# Patient Record
Sex: Female | Born: 1975 | Race: White | Hispanic: No | Marital: Single | Smoking: Current every day smoker
Health system: Southern US, Community
[De-identification: ages and names within clinical notes are randomized; demographics above are authoritative.]

## PROBLEM LIST (undated history)

## (undated) DIAGNOSIS — G8929 Other chronic pain: Secondary | ICD-10-CM

## (undated) DIAGNOSIS — M549 Dorsalgia, unspecified: Secondary | ICD-10-CM

## (undated) HISTORY — PX: CHOLECYSTECTOMY: SHX55

## (undated) HISTORY — PX: TUBAL LIGATION: SHX77

---

## 2012-07-24 ENCOUNTER — Encounter (HOSPITAL_COMMUNITY): Payer: Self-pay | Admitting: Emergency Medicine

## 2012-07-24 ENCOUNTER — Emergency Department (HOSPITAL_COMMUNITY)
Admission: EM | Admit: 2012-07-24 | Discharge: 2012-07-24 | Disposition: A | Discharge status: Home / Self Care | Payer: Self-pay | Attending: Emergency Medicine | Admitting: Emergency Medicine

## 2012-07-24 ENCOUNTER — Emergency Department (HOSPITAL_COMMUNITY): Payer: Self-pay

## 2012-07-24 DIAGNOSIS — R0789 Other chest pain: Secondary | ICD-10-CM

## 2012-07-24 DIAGNOSIS — M25519 Pain in unspecified shoulder: Secondary | ICD-10-CM | POA: Insufficient documentation

## 2012-07-24 DIAGNOSIS — R071 Chest pain on breathing: Secondary | ICD-10-CM | POA: Insufficient documentation

## 2012-07-24 DIAGNOSIS — F17200 Nicotine dependence, unspecified, uncomplicated: Secondary | ICD-10-CM | POA: Insufficient documentation

## 2012-07-24 MED ORDER — HYDROCODONE-ACETAMINOPHEN 5-325 MG PO TABS: 2.0000 | ORAL_TABLET | ORAL | Status: DC | PRN

## 2012-07-24 MED ORDER — HYDROCODONE-ACETAMINOPHEN 5-325 MG PO TABS
2.0000 | ORAL_TABLET | Freq: Once | ORAL | Status: AC
  Administered 2012-07-24: 2 via ORAL
  Filled 2012-07-24: qty 2

## 2012-07-24 NOTE — ED Notes (Signed)
Pt c/o central chest pain radiates under left arm onset yesterday when she woke up. Pt tried heating pad and ice packs along with ibuprofen without relief. Pt denies any other symptoms. Pt tearful.

## 2012-07-24 NOTE — ED Provider Notes (Addendum)
History     CSN: [id number]  Arrival date & time 07/24/12  0719   First MD Initiated Contact with Patient 07/24/12 [phone]      Chief Complaint  Patient presents with  . Chest Pain    (Consider location/radiation/quality/duration/timing/severity/associated sxs/prior treatment) HPI Complains of left shoulder pain radiating to left chest onset upon awakening 10 AM yesterday. Pain is worse with changing positions or moving her left shoulder she denies shortness of breath nausea or or sweatiness. Pain is not made worse with exertion pain is improved with remaining still. She treated herself with ibuprofen and a cold pack without relief. Cardiac risk factors smoker otherwise negative  History reviewed. No pertinent past medical history. Past medical history negative Past Surgical History  Procedure Laterality Date  . Cholecystectomy    . Tubal ligation      No family history on file.  History  Substance Use Topics  . Smoking status: Current Every Day Smoker  . Smokeless tobacco: Not on file  . Alcohol Use: No    OB History   Grav Para Term Preterm Abortions TAB SAB Ect Mult Living                  Review of Systems  Constitutional: Negative.   HENT: Negative.   Respiratory: Negative.   Cardiovascular: Positive for chest pain.  Gastrointestinal: Negative.   Musculoskeletal: Positive for arthralgias.       Left shoulder pain  Skin: Negative.   Neurological: Negative.   Psychiatric/Behavioral: Negative.   All other systems reviewed and are negative.    Allergies  Review of patient's allergies indicates no known allergies.  Home Medications  No current outpatient prescriptions on file.  BP 123/74  Pulse 88  Temp(Src) 97.7 F (36.5 C) (Oral)  Resp 22  Wt 165 lb (74.844 kg)  SpO2 99%  LMP 07/10/2012  Physical Exam  Nursing note and vitals reviewed. Constitutional: She appears well-developed and well-nourished. She appears distressed.  tearful  HENT:  Head:  Normocephalic and atraumatic.  Eyes: Conjunctivae are normal. Pupils are equal, round, and reactive to light.  Neck: Neck supple. No tracheal deviation present. No thyromegaly present.  Cardiovascular: Normal rate, regular rhythm and normal heart sounds.  Exam reveals no friction rub.   No murmur heard. Pulmonary/Chest: Effort normal and breath sounds normal. She exhibits tenderness.  Pain is easily reproducible when she sits up from a supine position or with forcible abduction of left shoulder. All 4 extremities without redness swelling or tenderness. Neurovascular intactleft upper extremity she has pain of left shoulder upon active motion  Abdominal: Soft. Bowel sounds are normal. She exhibits no distension. There is no tenderness.  Musculoskeletal: Normal range of motion. She exhibits no edema and no tenderness.  Neurological: She is alert. Coordination normal.  Skin: Skin is warm and dry. No rash noted.  Psychiatric:  tearful    ED Course  Procedures (including critical care time)  Labs Reviewed - No data to display No results found.   Date: 07/24/2012  Rate: 95  Rhythm: normal sinus rhythm  QRS Axis: normal  Intervals: normal  ST/T Wave abnormalities: nonspecific T wave changes  Conduction Disutrbances:none  Narrative Interpretation:   Old EKG Reviewed: none availableand Chest x-ray viewed by me No diagnosis found. No results found for this or any previous visit. Dg Chest Port 1 View  07/24/2012   *RADIOLOGY REPORT*  Clinical Data: Chest pain  PORTABLE CHEST - 1 VIEW  Comparison: None.  Findings:  No pneumothorax. Lungs clear.  Heart size and pulmonary vascularity normal.  No effusion.  Visualized bones unremarkable.  IMPRESSION: No acute disease   Original Report Authenticated By: D. Andria Rhein, MD    8:45 AM feels much improved after treatment with Norco.   MDM  Strongly doubt cardiac etiology in this young man drinking with highly atypical symptoms, nonspecific EKG.  Doubt pulmonary embolism atypical symptoms no shortness of breathPERC negative Symptoms and exam consistent with musculoskeletal chest pain Plan prescription Norco Referral resource guide Stop smoking encouraged    Dx #1 chest wall pain #2 tobacco abuse    Doug Sou, MD 07/24/12 [id number]  Doug Sou, MD 07/24/12 [phone]

## 2013-04-18 ENCOUNTER — Encounter (HOSPITAL_COMMUNITY): Payer: Self-pay | Admitting: Emergency Medicine

## 2013-04-18 ENCOUNTER — Emergency Department (HOSPITAL_COMMUNITY)
Admission: EM | Admit: 2013-04-18 | Discharge: 2013-04-18 | Disposition: A | Discharge status: Home / Self Care | Payer: Self-pay | Attending: Emergency Medicine | Admitting: Emergency Medicine

## 2013-04-18 DIAGNOSIS — G8929 Other chronic pain: Secondary | ICD-10-CM | POA: Insufficient documentation

## 2013-04-18 DIAGNOSIS — M549 Dorsalgia, unspecified: Secondary | ICD-10-CM

## 2013-04-18 DIAGNOSIS — F17200 Nicotine dependence, unspecified, uncomplicated: Secondary | ICD-10-CM | POA: Insufficient documentation

## 2013-04-18 DIAGNOSIS — M545 Low back pain, unspecified: Secondary | ICD-10-CM | POA: Insufficient documentation

## 2013-04-18 MED ORDER — HYDROCODONE-ACETAMINOPHEN 5-325 MG PO TABS: 1.0000 | ORAL_TABLET | ORAL | Status: DC | PRN

## 2013-04-18 MED ORDER — CYCLOBENZAPRINE HCL 10 MG PO TABS: 10.0000 mg | ORAL_TABLET | Freq: Every day | ORAL | Status: DC

## 2013-04-18 MED ORDER — KETOROLAC TROMETHAMINE 15 MG/ML IJ SOLN
30.0000 mg | Freq: Once | INTRAMUSCULAR | Status: AC
  Administered 2013-04-18: 30 mg via INTRAMUSCULAR
  Filled 2013-04-18: qty 2

## 2013-04-18 MED ORDER — KETOROLAC TROMETHAMINE 30 MG/ML IJ SOLN: 30.0000 mg | Freq: Once | INTRAMUSCULAR | Status: DC

## 2013-04-18 NOTE — ED Notes (Signed)
Per EMS: pt c/o mid - hip area back pain. Started yesterday around 1300, denies injury or heavy lifting. Has progressively worsen.

## 2013-04-18 NOTE — Discharge Instructions (Signed)
Call for a follow up appointment with a Family or Primary Care Provider.  °Return if Symptoms worsen.   °Take medication as prescribed.  ° °

## 2013-04-18 NOTE — ED Provider Notes (Signed)
CSN: [id number]     Arrival date & time 04/18/13  1853 History  This chart was scribed for non-physician practitioner Mellody DrownLauren Faizan Geraci, PA-C working with Dr. Pricilla LovelessScott Goldston by Donne Anonayla Curran, ED Scribe. This patient was seen in room WTR5/WTR5 and the patient's care was started at 2039.   First MD Initiated Contact with Patient 04/18/13 2039     Chief Complaint  Patient presents with  . Back Pain     The history is provided by the patient. No language interpreter was used.   HPI Comments: Jasmin LoraCrystal Cardona is a 38 y.o. female brought in by ambulance with a past medical history of chronic back pain, who presents to the Emergency Department complaining of gradual onset, gradually worsening mid back pain that began yesterday and bilateral chronic low back pain and spasms that shoots down her legs and worsened yesterday. She denies recent trauma or injury. Bending over makes the pain worse. She has tried 800 mg ibuprofen with little relief. She denies fever, chills, numbness or weakness in her lower extremities or any other symptoms. She has never had imaging of her back or hips. She was previously referred to a spine specialist but never followed up.     History reviewed. No pertinent past medical history. Past Surgical History  Procedure Laterality Date  . Cholecystectomy    . Tubal ligation     No family history on file. History  Substance Use Topics  . Smoking status: Current Every Day Smoker  . Smokeless tobacco: Not on file  . Alcohol Use: No   OB History   Grav Para Term Preterm Abortions TAB SAB Ect Mult Living                 Review of Systems  Constitutional: Negative for fever and chills.  Genitourinary: Negative for urgency, decreased urine volume, enuresis and difficulty urinating.  Musculoskeletal: Positive for arthralgias and back pain. Negative for gait problem, neck pain and neck stiffness.  Neurological: Negative for weakness and numbness.  All other systems reviewed and  are negative.      Allergies  Shellfish allergy  Home Medications   Current Outpatient Rx  Name  Route  Sig  Dispense  Refill  . ibuprofen (ADVIL,MOTRIN) 200 MG tablet   Oral   Take 200 mg by mouth every 8 (eight) hours as needed (pain).           BP 130/78  Pulse 82  Temp(Src) 98 F (36.7 C) (Oral)  Resp 24  SpO2 98%  Physical Exam  Nursing note and vitals reviewed. Constitutional: She appears well-developed and well-nourished. No distress.  HENT:  Head: Normocephalic and atraumatic.  Eyes: Conjunctivae are normal.  Neck: Neck supple. No tracheal deviation present.  Cardiovascular: Normal rate.   Pulmonary/Chest: Effort normal. No respiratory distress.  Musculoskeletal: Normal range of motion.       Back:  Two soft deep mobile masses overlying bilateral SI joints.  No signs of infection, overlying erythema.  Neurological: She is alert. She has normal strength. No sensory deficit. She exhibits normal muscle tone.  Reflex Scores:      Patellar reflexes are 2+ on the right side and 2+ on the left side. Skin: Skin is warm and dry.  Psychiatric: She has a normal mood and affect. Her behavior is normal.    ED Course  Procedures (including critical care time) DIAGNOSTIC STUDIES: Oxygen Saturation is 98% on RA, normal by my interpretation.    COORDINATION OF CARE: 8:42  PM Discussed treatment plan which includes Vicodin and Flexeril with pt at bedside and pt agreed to plan.    Labs Review Labs Reviewed - No data to display Imaging Review No results found.  EKG Interpretation   None       MDM   Final diagnoses:  Low back pain  Mid back pain   Pt with low back pain and mid back pain.  OSH record review, pt has been seen twice before for similar complaints (08/24/12, 02/26/13) and has not followed up with a specialist.  Patient with back pain.  No neurological deficits and normal neuro exam.  Patient can walk but states is painful.  No loss of bowel or  bladder control.  No concern for cauda equina  RICE protocol and pain medicine indicated and discussed with patient.    Meds given in ED:  Medications  ketorolac (TORADOL) 15 MG/ML injection 30 mg (30 mg Intramuscular Given 04/18/13 2126)    Discharge Medication List as of 04/18/2013  8:53 PM    START taking these medications   Details  cyclobenzaprine (FLEXERIL) 10 MG tablet Take 1 tablet (10 mg total) by mouth at bedtime., Starting 04/18/2013, Until Discontinued, Print    HYDROcodone-acetaminophen (NORCO/VICODIN) 5-325 MG per tablet Take 1 tablet by mouth every 4 (four) hours as needed., Starting 04/18/2013, Until Discontinued, Print         I personally performed the services described in this documentation, which was scribed in my presence. The recorded information has been reviewed and is accurate.     Clabe Seal, PA-C 04/21/13 [phone]

## 2013-04-22 NOTE — ED Provider Notes (Signed)
Medical screening examination/treatment/procedure(s) were performed by non-physician practitioner and as supervising physician I was immediately available for consultation/collaboration.   EKG Interpretation None        Audree CamelScott T Nehal Witting, MD 04/22/13 [phone]

## 2013-07-31 ENCOUNTER — Emergency Department (HOSPITAL_COMMUNITY): Payer: Self-pay

## 2013-07-31 ENCOUNTER — Emergency Department (HOSPITAL_COMMUNITY)
Admission: EM | Admit: 2013-07-31 | Discharge: 2013-08-01 | Disposition: A | Discharge status: Home / Self Care | Payer: Self-pay | Attending: Emergency Medicine | Admitting: Emergency Medicine

## 2013-07-31 ENCOUNTER — Encounter (HOSPITAL_COMMUNITY): Payer: Self-pay | Admitting: Emergency Medicine

## 2013-07-31 DIAGNOSIS — G8929 Other chronic pain: Secondary | ICD-10-CM | POA: Insufficient documentation

## 2013-07-31 DIAGNOSIS — F17200 Nicotine dependence, unspecified, uncomplicated: Secondary | ICD-10-CM | POA: Insufficient documentation

## 2013-07-31 DIAGNOSIS — M545 Low back pain, unspecified: Secondary | ICD-10-CM | POA: Insufficient documentation

## 2013-07-31 DIAGNOSIS — M549 Dorsalgia, unspecified: Secondary | ICD-10-CM

## 2013-07-31 DIAGNOSIS — M546 Pain in thoracic spine: Secondary | ICD-10-CM | POA: Insufficient documentation

## 2013-07-31 DIAGNOSIS — Z3202 Encounter for pregnancy test, result negative: Secondary | ICD-10-CM | POA: Insufficient documentation

## 2013-07-31 LAB — PREGNANCY, URINE: Preg Test, Ur: NEGATIVE

## 2013-07-31 LAB — URINALYSIS, ROUTINE W REFLEX MICROSCOPIC
BILIRUBIN URINE: NEGATIVE
Glucose, UA: NEGATIVE mg/dL
Hgb urine dipstick: NEGATIVE
KETONES UR: NEGATIVE mg/dL
Leukocytes, UA: NEGATIVE
NITRITE: NEGATIVE
PH: 6 (ref 5.0–8.0)
Protein, ur: NEGATIVE mg/dL
Specific Gravity, Urine: 1.011 (ref 1.005–1.030)
Urobilinogen, UA: 0.2 mg/dL (ref 0.0–1.0)

## 2013-07-31 MED ORDER — OXYCODONE-ACETAMINOPHEN 5-325 MG PO TABS
1.0000 | ORAL_TABLET | Freq: Once | ORAL | Status: AC
  Administered 2013-07-31: 1 via ORAL
  Filled 2013-07-31: qty 1

## 2013-07-31 MED ORDER — HYDROMORPHONE HCL PF 1 MG/ML IJ SOLN: 1.0000 mg | Freq: Once | INTRAMUSCULAR | Status: DC

## 2013-07-31 MED ORDER — KETOROLAC TROMETHAMINE 60 MG/2ML IM SOLN
30.0000 mg | Freq: Once | INTRAMUSCULAR | Status: AC
  Administered 2013-07-31: 30 mg via INTRAMUSCULAR
  Filled 2013-07-31: qty 2

## 2013-07-31 NOTE — ED Notes (Addendum)
Pt requesting pain medication, PA informed.

## 2013-07-31 NOTE — ED Provider Notes (Signed)
CSN: [id number]     Arrival date & time 07/31/13  1400 History  This chart was scribed for non-physician practitioner, Raymon MuttonMarissa Benjamin Casanas, PA-C working with Vanetta MuldersScott Zackowski, MD by Greggory StallionKayla Andersen, ED scribe. This patient was seen in room TR07C/TR07C and the patient's care was started at 4:22 PM.   Chief Complaint  Patient presents with  . Back Pain   The history is provided by the patient. No language interpreter was used.   HPI Comments: Jasmin Gregory is a 38 y.o. female with past medical history of chronic back pain with history of epidural injections and blood patches presenting to the ED with back pain that has exacerbated the past couple of weeks. Patient reports that the back pain is localized to her middle and lower back region. Stated the pain is worse with motion. Stated that when she bends over she feels as if her whole spine as pulling. Patient reported that she's noticed a lower back cyst has been present for the past 10 years-as per patient, reported that the cyst has gotten larger and pain has exacerbated. Stated that the discomfort radiates down her left leg. As per patient, reported that she's been using ibuprofen, magnesium, vitamin B without relief. Stated that she's been battling back pain for the past 11 years. Reported that she's been seen by physician and pain management in [place] one year ago. Denied injury, fall, numbness, tingling, loss of sensation, urinary and bowel incontinence, chest pain, shortness of breath, difficulty breathing.  History reviewed. No pertinent past medical history. Past Surgical History  Procedure Laterality Date  . Cholecystectomy    . Tubal ligation     No family history on file. History  Substance Use Topics  . Smoking status: Current Every Day Smoker  . Smokeless tobacco: Not on file  . Alcohol Use: No   OB History   Grav Para Term Preterm Abortions TAB SAB Ect Mult Living                  Review of Systems  Eyes: Negative for visual disturbance.  Respiratory: Negative for shortness of breath.   Cardiovascular: Negative for chest pain.  Genitourinary:       Negative for bowel or bladder incontinence.  Musculoskeletal: Positive for back pain.  Neurological: Negative for numbness and headaches.  All other systems reviewed and are negative.  Allergies  Shellfish allergy  Home Medications   Prior to Admission medications   Medication Sig Start Date End Date Taking? Authorizing Provider  ibuprofen (ADVIL,MOTRIN) 200 MG tablet Take 200 mg by mouth every 8 (eight) hours as needed (pain).    Yes Historical Provider, MD  cyclobenzaprine (FLEXERIL) 10 MG tablet Take 1 tablet (10 mg total) by mouth 2 (two) times daily as needed for muscle spasms. 08/01/13   Catlin Aycock, PA-C  HYDROcodone-acetaminophen (NORCO/VICODIN) 5-325 MG per tablet Take 1 tablet by mouth every 6 (six) hours as needed for moderate pain or severe pain. 08/01/13   Aniyiah Zell, PA-C  ibuprofen (ADVIL,MOTRIN) 600 MG tablet Take 1 tablet (600 mg total) by mouth every 6 (six) hours as needed. 08/01/13   Naydeline Morace, PA-C   BP 115/70  Pulse 66  Temp(Src) 97.4 F (36.3 C) (Oral)  Resp 16  Wt 165 lb (74.844 kg)  SpO2 100%  LMP 07/03/2013  Physical Exam  Nursing note and vitals reviewed. Constitutional: She is oriented to person, place, and time. She appears well-developed and well-nourished. No  distress.  HENT:  Head: Normocephalic and atraumatic.  Mouth/Throat: Oropharynx is clear and moist. No oropharyngeal exudate.  Eyes: Conjunctivae and EOM are normal. Pupils are equal, round, and reactive to light. Right eye exhibits no discharge. Left eye exhibits no discharge.  Neck: Normal range of motion. Neck supple. No tracheal deviation present.  Negative nuchal rigidity Negative neck stiffness Negative neck swelling Negative meningeal signs  Discomfort upon palpation to the mid spinal region as  well as bilateral musculature of the neck  Cardiovascular: Normal rate, regular rhythm and normal heart sounds.   Pulses:      Radial pulses are 2+ on the right side, and 2+ on the left side.       Dorsalis pedis pulses are 2+ on the right side, and 2+ on the left side.  Pulmonary/Chest: Effort normal and breath sounds normal. No respiratory distress. She has no wheezes. She has no rales.  Musculoskeletal: Normal range of motion. She exhibits tenderness. She exhibits no edema.       Back:  Negative deformities noted to the spine. Discomfort upon palpation to mid thoracic and left paraspinal lumbosacral region. Small mobile cyst palpated in the left lumbosacral region-soft but tender upon palpation. Decreased flexion and extension of the torso secondary to discomfort in the back. Decreased flexion and extension of the hip secondary to back pain. Full range of motion without difficulty or ataxia to remaining extremities.  Lymphadenopathy:    She has no cervical adenopathy.  Neurological: She is alert and oriented to person, place, and time. No cranial nerve deficit. She exhibits normal muscle tone. Coordination normal.  Cranial nerves III-XII grossly intact Strength 5+/5+ to upper and lower extremities bilaterally with resistance applied, equal distribution noted Equal grip strength Sensation intact with differentiation to sharp and dull touch Negative saddle paresthesias bilaterally Negative arm drift Fine motor skills intact Gait proper, proper balance - negative sway, negative drift, negative step-offs. Mild limp when applying pressure to the left leg.  Skin: Skin is warm and dry.  Psychiatric: She has a normal mood and affect. Her behavior is normal.    ED Course  Procedures (including critical care time)  DIAGNOSTIC STUDIES: Oxygen Saturation is 100% on RA, normal by my interpretation.    COORDINATION OF CARE: 4:31 PM-Discussed treatment plan which includes speaking with  attending with pt at bedside and pt agreed to plan.   4:54 PM This provider spoke with attending, Dr. Darlyn Chamber. Attending in room assessing patient. As per attending physician recommended patient to get MRI of the thoracic and lumbar spine.  Results for orders placed during the hospital encounter of 07/31/13  URINALYSIS, ROUTINE W REFLEX MICROSCOPIC      Result Value Ref Range   Color, Urine YELLOW  YELLOW   APPearance CLEAR  CLEAR   Specific Gravity, Urine 1.011  1.005 - 1.030   pH 6.0  5.0 - 8.0   Glucose, UA NEGATIVE  NEGATIVE mg/dL   Hgb urine dipstick NEGATIVE  NEGATIVE   Bilirubin Urine NEGATIVE  NEGATIVE   Ketones, ur NEGATIVE  NEGATIVE mg/dL   Protein, ur NEGATIVE  NEGATIVE mg/dL   Urobilinogen, UA 0.2  0.0 - 1.0 mg/dL   Nitrite NEGATIVE  NEGATIVE   Leukocytes, UA NEGATIVE  NEGATIVE  PREGNANCY, URINE      Result Value Ref Range   Preg Test, Ur NEGATIVE  NEGATIVE    Labs Review Labs Reviewed  URINALYSIS, ROUTINE W REFLEX MICROSCOPIC  PREGNANCY, URINE  POC URINE PREG,  ED    Imaging Review Mr Thoracic Spine Wo Contrast  07/31/2013   CLINICAL DATA:  Left flank and groin pain, remote history of cerebral spinal fluid leak requiring blood patch.  EXAM: MRI THORACIC AND LUMBAR SPINE WITHOUT CONTRAST  TECHNIQUE: Multiplanar and multiecho pulse sequences of the thoracic and lumbar spine were obtained without intravenous contrast.  COMPARISON:  None.  FINDINGS: MR THORACIC SPINE FINDINGS  Thoracic vertebral bodies and posterior elements are intact aligned and maintenance of the thoracic kyphosis. Intervertebral discs demonstrate normal morphology and signal characteristics. No abnormal bone marrow signal, specifically no bright STIR signal to suggest acute osseous process.  Conus medullaris terminates at at least L1, not fully imaged on this dedicated thoracic spine MRI. Thoracic spinal cord appears normal morphology and signal characteristics, unremarkable. Included prevertebral and  paraspinal soft tissues are nonsuspicious.  Mild motion degraded axial sequences show no disc bulge, canal stenosis or neural foraminal narrowing at any level. Mild lower thorax thoracic facet arthropathy.  MR LUMBAR SPINE FINDINGS  Lumbar vertebral bodies and Posterior elements are intact and aligned and maintenance of lumbar lordosis. Intervertebral discs demonstrate normal morphology and signal characteristics. Intervertebral disc demonstrate normal morphology, slight decreased T2 signal within the L4-5 disc most consistent mild desiccation. No abnormal bone marrow signal, specifically no bright STIR signal to suggest acute osseous process.  Conus medullaris terminates at L1 appears normal morphology and signal characteristics. Cauda equina is unremarkable. Included prevertebral and paraspinal soft tissues are nonsuspicious.  Level by level evaluation:  T12-L1, L1-2, L2-3: No disc bulge, canal stenosis nor neural foraminal narrowing.  L3-4: Small left extra foraminal disc protrusion with annular fissure which could affect the exited left L3 nerve. No canal stenosis. No neural foraminal narrowing.  L4-5: Annular bulging, with focal right extra foraminal annular tear, which could affect the exited right L4 nerve. No canal stenosis or neural foraminal narrowing.  L5-S1: No disc bulge, canal stenosis nor neural foraminal narrowing.  IMPRESSION: MR THORACIC SPINE IMPRESSION  Normal MRI of the thoracic spine without contrast.  MR LUMBAR SPINE IMPRESSION  No acute fracture nor malalignment.  Early lumbar spondylosis, with L3-4 and L4-5 extra foraminal annular tears which could affect the exited left L3 and right L4 nerves respectively.  No canal stenosis or neural foraminal narrowing.   Electronically Signed   By: Awilda Metro   On: 07/31/2013 22:44   Mr Lumbar Spine Wo Contrast  07/31/2013   CLINICAL DATA:  Left flank and groin pain, remote history of cerebral spinal fluid leak requiring blood patch.  EXAM: MRI  THORACIC AND LUMBAR SPINE WITHOUT CONTRAST  TECHNIQUE: Multiplanar and multiecho pulse sequences of the thoracic and lumbar spine were obtained without intravenous contrast.  COMPARISON:  None.  FINDINGS: MR THORACIC SPINE FINDINGS  Thoracic vertebral bodies and posterior elements are intact aligned and maintenance of the thoracic kyphosis. Intervertebral discs demonstrate normal morphology and signal characteristics. No abnormal bone marrow signal, specifically no bright STIR signal to suggest acute osseous process.  Conus medullaris terminates at at least L1, not fully imaged on this dedicated thoracic spine MRI. Thoracic spinal cord appears normal morphology and signal characteristics, unremarkable. Included prevertebral and paraspinal soft tissues are nonsuspicious.  Mild motion degraded axial sequences show no disc bulge, canal stenosis or neural foraminal narrowing at any level. Mild lower thorax thoracic facet arthropathy.  MR LUMBAR SPINE FINDINGS  Lumbar vertebral bodies and Posterior elements are intact and aligned and maintenance of lumbar lordosis. Intervertebral discs demonstrate normal  morphology and signal characteristics. Intervertebral disc demonstrate normal morphology, slight decreased T2 signal within the L4-5 disc most consistent mild desiccation. No abnormal bone marrow signal, specifically no bright STIR signal to suggest acute osseous process.  Conus medullaris terminates at L1 appears normal morphology and signal characteristics. Cauda equina is unremarkable. Included prevertebral and paraspinal soft tissues are nonsuspicious.  Level by level evaluation:  T12-L1, L1-2, L2-3: No disc bulge, canal stenosis nor neural foraminal narrowing.  L3-4: Small left extra foraminal disc protrusion with annular fissure which could affect the exited left L3 nerve. No canal stenosis. No neural foraminal narrowing.  L4-5: Annular bulging, with focal right extra foraminal annular tear, which could affect the  exited right L4 nerve. No canal stenosis or neural foraminal narrowing.  L5-S1: No disc bulge, canal stenosis nor neural foraminal narrowing.  IMPRESSION: MR THORACIC SPINE IMPRESSION  Normal MRI of the thoracic spine without contrast.  MR LUMBAR SPINE IMPRESSION  No acute fracture nor malalignment.  Early lumbar spondylosis, with L3-4 and L4-5 extra foraminal annular tears which could affect the exited left L3 and right L4 nerves respectively.  No canal stenosis or neural foraminal narrowing.   Electronically Signed   By: Awilda Metro   On: 07/31/2013 22:44     EKG Interpretation None      MDM   Final diagnoses:  Back pain   Medications  oxyCODONE-acetaminophen (PERCOCET/ROXICET) 5-325 MG per tablet 1 tablet (1 tablet Oral Given 07/31/13 1809)  ketorolac (TORADOL) injection 30 mg (30 mg Intramuscular Given 07/31/13 2000)  oxyCODONE-acetaminophen (PERCOCET/ROXICET) 5-325 MG per tablet 1 tablet (1 tablet Oral Given 07/31/13 2338)    Filed Vitals:   07/31/13 1815 07/31/13 2029 07/31/13 2156 07/31/13 2338  BP: 114/67 104/59 108/66 115/70  Pulse: 65 90 56 66  Temp: 98.7 F (37.1 C)  98.2 F (36.8 C) 97.4 F (36.3 C)  TempSrc: Oral   Oral  Resp: 15 16 16 16   Weight:      SpO2: 98% 100% 99% 100%    I personally performed the services described in this documentation, which was scribed in my presence. The recorded information has been reviewed and is accurate.  Patient presenting to the ED with thoracic and lumbar back pain does been ongoing for the past 11 years. As per patient reported that the pain is grossly worse over the past 3 days. Patient recently moved from [place] to [place] approximately one year ago-does not have a primary care provider or neurosurgeon. Patient reported that in [place] she was under pain management. Stated that she has not had an MRI in over 10 years stash reported on the MRI she has a cyst forming that is leading to involvement of her nerves-was recommended  to get surgery but patient is concerned do this would be to permanent nerve damage. Stated the pain is gone progressively worse over the past 3 days and has become unbearable. It is grossly tender upon palpation to the midthoracic region as well as when she flexes she has tightness all along her spine. Patient seen and assessed by attending physician, Dr. Darlyn Chamber who recommended MRI of the thoracic and lumbar spine to be performed. Urinalysis negative for infection-negative nitrites or leukocytes identified. Urine pregnancy negative. MRI of thoracic spine negative acute findings. MRI of lumbar spine negative acute fracture malalignment noted-early lumbar spondylosis with L3-4 and L4-5 extra for minimal annular tears which could affect the exiting left L3 and right L4 nurse respectively. No canal stenosis or  neural forminal narrowing noted. Cauda equina is unremarkable. Reviewed MRI in great detail with attending physician, Dr. Keane Police. Plan for patient to be discharged. Doubt pyelonephritis. Doubt UTI. Doubt kidney stones. Doubt cauda equina syndrome. Doubt epidural abscess. Patient stable, afebrile. Patient not septic appearing. Pain controlled in ED setting. Discharged patient. [facility]. Discussed with patient to avoid any physical strenuous activity. Recommended heat compressions. Discharge patient with pain medications discussed course, cautions, disposal technique. Discussed with patient to closely monitor symptoms and if symptoms are to worsen or change to report back to the ED - strict return instructions given.  Patient agreed to plan of care, understood, all questions answered.   Raymon Mutton, PA-C 08/01/13 1715

## 2013-07-31 NOTE — ED Notes (Signed)
PT is still in the MRI DEPT.

## 2013-07-31 NOTE — ED Notes (Signed)
Pt. Stated, I've had bad back pain for 3 days.  The pain shoots across my hips.  I have a cyst on my back.

## 2013-07-31 NOTE — ED Notes (Signed)
MRI called to inquire about pt's scan.

## 2013-07-31 NOTE — ED Notes (Signed)
Pt reports has cyst to left lower back, past 2 days worsening back pain. Denies urinary s/s. Reports epidural's and spinal blood patches 10 years ago. Pt can bear wt pain worse with movement

## 2013-07-31 NOTE — ED Provider Notes (Signed)
Medical screening examination/treatment/procedure(s) were conducted as a shared visit with non-physician practitioner(s) and myself.  I personally evaluated the patient during the encounter.   EKG Interpretation None      Patient seen by me. Patient with a long-standing history of back problems. Recently moved here from [place] has not gotten plugged in with any regular doctor yet. Patient denies any numbness or weakness to her feet. But does have some shooting pain down into the legs. Patient's concerned about having a cyst on her back. She said the MRI that was done 11 years ago showed assessed she is concerned it that's getting bigger. Patient also had several injections to her mid thoracic lumbar area she states in the past. She is not febrile not toxic however we'll go ahead and do the MRI of the lumbar thoracic area to get a baseline of what is going on here. If MRI without significant findings patient can be discharged home with pain control. Patient will need appropriate referrals.  Vanetta Mulders, MD 07/31/13 [phone]

## 2013-08-01 MED ORDER — CYCLOBENZAPRINE HCL 10 MG PO TABS: 10.0000 mg | ORAL_TABLET | Freq: Two times a day (BID) | ORAL | Status: DC | PRN

## 2013-08-01 MED ORDER — HYDROCODONE-ACETAMINOPHEN 5-325 MG PO TABS: 1.0000 | ORAL_TABLET | Freq: Four times a day (QID) | ORAL | Status: DC | PRN

## 2013-08-01 MED ORDER — IBUPROFEN 600 MG PO TABS: 600.0000 mg | ORAL_TABLET | Freq: Four times a day (QID) | ORAL | Status: DC | PRN

## 2013-08-01 NOTE — Discharge Instructions (Signed)
Please call and set up an appointment with health and wellness Center Please call and set up an appointment with neurosurgery Please rest and stay hydrated Please take medications as prescribed-while on medications there is to be no drinking alcohol, driving, operating any heavy machinery. If there is extra please dispose in a proper manner. Please do not take any extra Tylenol for this can lead to Tylenol overdose and liver issues. Please apply warm compressions and massage with icy hot ointment Please continue to monitor symptoms closely and if symptoms are to worsen or change (fever greater than 101, chills, chest pain, shortness of breath, difficulty breathing, numbness, tingling, fall, injury, loss of sensation to legs, weakness, inability to control urine or bowel movements, abdominal pain, nausea, vomiting, worsening or changes to pain pattern) please report back to emergency department immediately   Back Pain, Adult Low back pain is very common. About 1 in 5 people have back pain.The cause of low back pain is rarely dangerous. The pain often gets better over time.About half of people with a sudden onset of back pain feel better in just 2 weeks. About 8 in 10 people feel better by 6 weeks.  CAUSES Some common causes of back pain include:  Strain of the muscles or ligaments supporting the spine.  Wear and tear (degeneration) of the spinal discs.  Arthritis.  Direct injury to the back. DIAGNOSIS Most of the time, the direct cause of low back pain is not known.However, back pain can be treated effectively even when the exact cause of the pain is unknown.Answering your caregiver's questions about your overall health and symptoms is one of the most accurate ways to make sure the cause of your pain is not dangerous. If your caregiver needs more information, he or she may order lab work or imaging tests (X-rays or MRIs).However, even if imaging tests show changes in your back, this usually  does not require surgery. HOME CARE INSTRUCTIONS For many people, back pain returns.Since low back pain is rarely dangerous, it is often a condition that people can learn to [facility] their own.   Remain active. It is stressful on the back to sit or stand in one place. Do not sit, drive, or stand in one place for more than 30 minutes at a time. Take short walks on level surfaces as soon as pain allows.Try to increase the length of time you walk each day.  Do not stay in bed.Resting more than 1 or 2 days can delay your recovery.  Do not avoid exercise or work.Your body is made to move.It is not dangerous to be active, even though your back may hurt.Your back will likely heal faster if you return to being active before your pain is gone.  Pay attention to your body when you bend and lift. Many people have less discomfortwhen lifting if they bend their knees, keep the load close to their bodies,and avoid twisting. Often, the most comfortable positions are those that put less stress on your recovering back.  Find a comfortable position to sleep. Use a firm mattress and lie on your side with your knees slightly bent. If you lie on your back, put a pillow under your knees.  Only take over-the-counter or prescription medicines as directed by your caregiver. Over-the-counter medicines to reduce pain and inflammation are often the most helpful.Your caregiver may prescribe muscle relaxant drugs.These medicines help dull your pain so you can more quickly return to your normal activities and healthy exercise.  Put ice  on the injured area.  Put ice in a plastic bag.  Place a towel between your skin and the bag.  Leave the ice on for 15-20 minutes, 03-04 times a day for the first 2 to 3 days. After that, ice and heat may be alternated to reduce pain and spasms.  Ask your caregiver about trying back exercises and gentle massage. This may be of some benefit.  Avoid feeling anxious or  stressed.Stress increases muscle tension and can worsen back pain.It is important to recognize when you are anxious or stressed and learn ways to manage it.Exercise is a great option. SEEK MEDICAL CARE IF:  You have pain that is not relieved with rest or medicine.  You have pain that does not improve in 1 week.  You have new symptoms.  You are generally not feeling well. SEEK IMMEDIATE MEDICAL CARE IF:   You have pain that radiates from your back into your legs.  You develop new bowel or bladder control problems.  You have unusual weakness or numbness in your arms or legs.  You develop nausea or vomiting.  You develop abdominal pain.  You feel faint. Document Released: 02/09/2005 Document Revised: 08/11/2011 Document Reviewed: 06/30/2010 [facility] Patient Information 2014 [place], [place].  Back Exercises Back exercises help treat and prevent back injuries. The goal of back exercises is to increase the strength of your abdominal and back muscles and the flexibility of your back. These exercises should be started when you no longer have back pain. Back exercises include:  Pelvic Tilt. Lie on your back with your knees bent. Tilt your pelvis until the lower part of your back is against the floor. Hold this position 5 to 10 sec and repeat 5 to 10 times.  Knee to Chest. Pull first 1 knee up against your chest and hold for 20 to 30 seconds, repeat this with the other knee, and then both knees. This may be done with the other leg straight or bent, whichever feels better.  Sit-Ups or Curl-Ups. Bend your knees 90 degrees. Start with tilting your pelvis, and do a partial, slow sit-up, lifting your trunk only 30 to 45 degrees off the floor. Take at least 2 to 3 seconds for each sit-up. Do not do sit-ups with your knees out straight. If partial sit-ups are difficult, simply do the above but with only tightening your abdominal muscles and holding it as directed.  Hip-Lift. Lie on your back  with your knees flexed 90 degrees. Push down with your feet and shoulders as you raise your hips a couple inches off the floor; hold for 10 seconds, repeat 5 to 10 times.  Back arches. Lie on your stomach, propping yourself up on bent elbows. Slowly press on your hands, causing an arch in your low back. Repeat 3 to 5 times. Any initial stiffness and discomfort should lessen with repetition over time.  Shoulder-Lifts. Lie face down with arms beside your body. Keep hips and torso pressed to floor as you slowly lift your head and shoulders off the floor. Do not overdo your exercises, especially in the beginning. Exercises may cause you some mild back discomfort which lasts for a few minutes; however, if the pain is more severe, or lasts for more than 15 minutes, do not continue exercises until you see your caregiver. Improvement with exercise therapy for back problems is slow.  See your caregivers for assistance with developing a proper back exercise program. Document Released: 03/19/2004 Document Revised: 05/04/2011 Document Reviewed: 12/11/2010 ExitCare  Patient Information 2014 [place], [place].   Emergency Department Resource Guide 1) Find a Doctor and Pay Out of Pocket Although you won't have to find out who is covered by your insurance plan, it is a good idea to ask around and get recommendations. You will then need to call the office and see if the doctor you have chosen will accept you as a new patient and what types of options they offer for patients who are self-pay. Some doctors offer discounts or will set up payment plans for their patients who do not have insurance, but you will need to ask so you aren't surprised when you get to your appointment.  2) Contact Your Local Health Department Not all health departments have doctors that can see patients for sick visits, but many do, so it is worth a call to see if yours does. If you don't know where your local health department is, you can check  in your phone book. The CDC also has a tool to help you locate your state's health department, and many state websites also have listings of all of their local health departments.  3) Find a Walk-in Clinic If your illness is not likely to be very severe or complicated, you may want to try a walk in clinic. These are popping up all over the country in pharmacies, drugstores, and shopping centers. They're usually staffed by nurse practitioners or physician assistants that have been trained to treat common illnesses and complaints. They're usually fairly quick and inexpensive. However, if you have serious medical issues or chronic medical problems, these are probably not your best option.  No Primary Care Doctor: - Call Health Connect at  [phone] - they can help you locate a primary care doctor that  accepts your insurance, provides certain services, etc. - Physician Referral Service- [phone]  Chronic Pain Problems: Organization         Address  Phone   Notes  [facility]  [phone] Patients need to be referred by their primary care doctor.   Medication Assistance: Organization         Address  Phone   Notes  [facility] Medication [facility] [address] [place] [place], [place] [address] [phone] --Must be a resident of [facility] -- Must have NO insurance coverage whatsoever (no Medicaid/ Medicare, etc.) -- The pt. MUST have a primary care doctor that directs their care regularly and follows them in the community   [facility]ist  [phone]   [employer]  [phone]    Agencies that provide inexpensive medical care: Organization         Address  Phone   Notes  [facility]  [phone]   [facility]    [phone]   [facility] [address] [place], [place] [address] [phone]   [facility] [address], [place] [phone]    Planned Parenthood    [phone]   [facility]    [phone]   [facility] and [facility]  [address], [place] Phone:  [phone], Fax:  [phone] Hours of Operation:  9 am - 6 pm, M-F.  Also accepts Medicaid/Medicare and self-pay.  [facility]  [address], [place] Phone: [phone], Fax: [phone]. Hours of Operation:  8:30 am - 5:30 pm, M-F.  Also accepts  Medicaid and self-pay.  [facility] [place] [address] [place] Phone: [phone]   [facility] [address] Natasha Bence [place], [place] [phone], Ext. [phone] Mondays & Thursdays: 7-9 AM.  First 15 patients are seen on a first come, first serve basis.    Medicaid-accepting [facility] Providers:  Organization         Address  Phone   Notes  [facility] [address], [place] [phone] Also accepts self-pay patients.  [facility] [address] Laurell Josephs [place], [place]  [phone]   [facility] [address], [place] [phone]   [facility] [address], [place] [phone]   Renaye Rakers [address], [place]   [phone] Only accepts [place] patients after they have their name applied to their card.   Self-Pay (no insurance) in [facility]:  Organization         Address  Phone   Notes  Sickle Cell Patients, [facility] Internal Medicine [address] [place], [place] [phone]   [facility] [address] [place], [place] [phone]   [facility] [place]  [address], [place] [phone]   [facility]/Dr. Osei-Bonsu  [address], [place] or [address], [place] [phone] Phone number for both [place] and [place] locations is the  same.  Urgent Medical and [facility] [address], [place] [phone]   [facility] [address], [place] or [address] [phone]   [facility] [address], [place] [phone], phone; [phone], fax Sees patients 1st and 3rd Saturday of every month.  Must not qualify for public or private insurance (i.e. Medicaid, Medicare, [place] Health Choice, Veterans' Benefits)  Household income should be no more than 200% of the poverty level The clinic cannot treat you if you are pregnant or think you are pregnant  Sexually transmitted diseases are not treated at the clinic.    Dental Care: Organization         Address  Phone  Notes  [facility] Department of [facility] [address] [place], [place] [phone] Accepts children up to age 93 who are enrolled in [place] or [place] Health Choice; pregnant women with a Medicaid card; and children who have applied for Medicaid or [place] Health Choice, but were declined, whose parents can pay a reduced fee at time of service.  [facility] Department of [facility]  [address], [place] [phone] Accepts children up to age 31 who are enrolled in [place] or [place] Health Choice; pregnant women with a Medicaid card; and children who have applied for Medicaid or [place] Health Choice, but were declined, whose parents can pay a reduced fee at time of service.  [facility]  [address] [place], [place] [phone] Patients are seen by appointment only. Walk-ins are not accepted. [facility] will see patients 2 years of age and older. Monday - Tuesday (8am-5pm) Most Wednesdays (8:30-5pm) $30 per visit, cash only  [facility] Adult Dental Access PROGRAM  [address], [facility] [place] [phone] Patients are seen by appointment only. Walk-ins are not accepted. [facility] will see patients  4 years of age and older. One Wednesday Evening (Monthly: Volunteer Based).  $30 per visit, cash only  [employer]  [phone] for adults; Children under age 62, call Graduate Pediatric Dentistry at [phone]. Children aged 40-14, please call [phone] to request a pediatric application.  Dental services are provided in all areas of dental care including fillings, crowns and bridges, complete and partial dentures, implants, gum treatment, root canals, and extractions. Preventive care is also provided. Treatment is provided to both adults and children. Patients are selected via a lottery and there is often a waiting list.   [facility] [address] [place] [address], [place]  [phone] [email or web address]   [facility] [address] [place], [place] [phone], Ext. [phone] Second and Fourth Thursday of each month, opens at 6:30 AM; Clinic ends at 9 AM.  Patients are seen on a first-come first-served basis, and a limited number are seen during each clinic.   [facility]  [address] Ether Griffins [place], [place] [phone]   Eligibility Requirements You must have lived in [place], [place], or [place] counties for at least the last three months.   You cannot be eligible for state or federal sponsored [place], including [employer], [place], or [employer].   You generally cannot be eligible for healthcare insurance through your employer.    How to apply: Eligibility screenings are held every Tuesday and Wednesday afternoon from 1:00 pm until 4:00 pm. You do not need an appointment for the interview!  [facility] [address] [place], [place] [phone]   [facility] Health Department  [phone]   [facility] Health Department  [phone]   [facility] Health Department  [phone]    Behavioral Health Resources in the Community: Intensive Outpatient  Programs Organization         Address  Phone  Notes  [facility] [address], [place], [place] [phone]   [facility] Outpatient [address], [place], [place] [phone]   [facility] [address], [place], [place]  [phone]   [facility] Mental Health [address],  [place], [place] [phone] or [phone]   Substance Abuse Resources Organization         Address  Phone  Notes  [facility]  [phone]   [facility]  [phone]   [facility] [place]  [phone]   Floydene Flock  [phone]   Residential & Outpatient Substance Abuse Program  [phone]   Psychological Services Organization         Address  Phone  Notes  [facility]  [phone]   [facility]  [phone]   [facility] Mental Health [address] [place] [phone] or [phone]    Mobile Crisis Teams Organization         Address  Phone  Notes  [facility], Mobile Crisis Care Unit  [phone]   [facility]  [address] [place], [place] [phone]   Doristine Locks [address] [place] [phone]    Self-Help/Support Groups Organization         Address  Phone             Notes  [facility] [place] - variety of support groups  336- I7437963 Call for more information  Narcotics Anonymous (NA), [facility] [address] [place]  2 meetings at this location   Residential Treatment Programs Organization         Address  Phone  Notes  [facility] [address],    [place]  [phone]   [facility]  [address] [place] [address], [place], [place] [phone]   [facility] [address] [place], [place] [phone] Admissions: 8am-3pm M-F  [facility] [address],    [place], [place]  [phone]   [facility] [address] [place], [place], [place] [phone]   [facility] [address]  [place], [place] [phone]   [facility] - Intensive Outpatient [address] Laurell Josephs [address], [place], [place] [phone]   [facility] ([facility]iction [facility] [address] [place]  [place], [place] [phone] or [phone]   [facility] ([facility]) [address], [place], [place] [phone] Accepts Medicaid  [facility] [place] [address],  [place] [phone] Substance Abuse/Addiction Treatment   [facility] Organization         Address  Phone  Notes  [facility]  [phone]   Angie Fava, PhD [address] Ervin Knack [place], [place]   ([phone] or [phone]   [facility] Behavioral   [address] [place], [place] [phone]   [facility] [address] [place], [place] [phone] Insurance/Medicaid/sponsorship through [facility] and Families [address]                                    [place], [place] [phone] Therapy/tele-psych/case  [facility] [address], [place] [phone]    Dr. Lolly Mustache  [phone]   [facility]  [employer] [facility] Dept. 1) [address], [place] 2) [address] [place] 3)  [address], [place] [phone]  [phone]   [facility] Child Abuse Hotline 256-781-3058 or 804-265-2891 (After Hours)

## 2013-08-01 NOTE — ED Notes (Signed)
Declined W/C at D/C and was escorted to lobby by RN. 

## 2013-09-04 ENCOUNTER — Encounter (HOSPITAL_COMMUNITY): Payer: Self-pay | Admitting: Emergency Medicine

## 2013-09-04 ENCOUNTER — Emergency Department (HOSPITAL_COMMUNITY)
Admission: EM | Admit: 2013-09-04 | Discharge: 2013-09-04 | Disposition: A | Discharge status: Home / Self Care | Payer: Self-pay | Attending: Emergency Medicine | Admitting: Emergency Medicine

## 2013-09-04 DIAGNOSIS — F17200 Nicotine dependence, unspecified, uncomplicated: Secondary | ICD-10-CM | POA: Insufficient documentation

## 2013-09-04 DIAGNOSIS — Z791 Long term (current) use of non-steroidal anti-inflammatories (NSAID): Secondary | ICD-10-CM | POA: Insufficient documentation

## 2013-09-04 DIAGNOSIS — G8929 Other chronic pain: Secondary | ICD-10-CM | POA: Insufficient documentation

## 2013-09-04 DIAGNOSIS — L049 Acute lymphadenitis, unspecified: Secondary | ICD-10-CM | POA: Insufficient documentation

## 2013-09-04 HISTORY — DX: Other chronic pain: G89.29

## 2013-09-04 HISTORY — DX: Dorsalgia, unspecified: M54.9

## 2013-09-04 MED ORDER — OXYCODONE-ACETAMINOPHEN 5-325 MG PO TABS
1.0000 | ORAL_TABLET | Freq: Once | ORAL | Status: AC
  Administered 2013-09-04: 1 via ORAL
  Filled 2013-09-04: qty 1

## 2013-09-04 MED ORDER — AMOXICILLIN 500 MG PO CAPS: 500.0000 mg | ORAL_CAPSULE | Freq: Three times a day (TID) | ORAL | Status: DC

## 2013-09-04 MED ORDER — AMOXICILLIN 500 MG PO CAPS
500.0000 mg | ORAL_CAPSULE | Freq: Once | ORAL | Status: AC
  Administered 2013-09-04: 500 mg via ORAL
  Filled 2013-09-04: qty 1

## 2013-09-04 MED ORDER — OXYCODONE-ACETAMINOPHEN 5-325 MG PO TABS: 1.0000 | ORAL_TABLET | ORAL | Status: DC | PRN

## 2013-09-04 NOTE — ED Notes (Signed)
Declined W/C at D/C and was escorted to lobby by RN. 

## 2013-09-04 NOTE — ED Provider Notes (Signed)
CSN: [id number]     Arrival date & time 09/04/13  [id number] History  This chart was scribed for non-physician practitioner Emilia BeckKaitlyn Lorianne Malbrough, PA-C working with Rolland PorterMark James, MD by Leone PayorSonum Patel, ED Scribe. This patient was seen in room TR05C/TR05C and the patient's care was started at 9:42 AM.    Chief Complaint  Patient presents with  . Jaw Pain    The history is provided by the patient. No language interpreter was used.    HPI Comments: Jasmin Gregory is a 38 y.o. female who presents to the Emergency Department complaining of 1 day of gradual onset, gradually worsening, constant right sided jaw pain with associated jaw swelling that began after chewing a gumball. She states initially she had no pain but it has progressed to 8/10, sharp pain with tingling sensations. She reports the swelling has progressed to the right neck upon waking this morning. She states the pain is worse with swallowing and opening her jaw. She has tried warm compresses, tylenol, and benadryl without relief. She denies dental pain, otalgia, sore throat, SOB, fever.   Past Medical History  Diagnosis Date  . Chronic back pain     Takes meds for chronic pain but is out of meds   Past Surgical History  Procedure Laterality Date  . Cholecystectomy    . Tubal ligation     History reviewed. No pertinent family history. History  Substance Use Topics  . Smoking status: Current Every Day Smoker  . Smokeless tobacco: Not on file  . Alcohol Use: No   OB History   Grav Para Term Preterm Abortions TAB SAB Ect Mult Living                 Review of Systems  HENT: Positive for facial swelling. Negative for dental problem, sore throat and trouble swallowing.        +jaw pain  Respiratory: Negative for shortness of breath.   Cardiovascular: Negative for chest pain.      Allergies  Shellfish allergy  Home Medications   Prior to Admission medications   Medication Sig Start Date End Date Taking? Authorizing Provider   cyclobenzaprine (FLEXERIL) 10 MG tablet Take 1 tablet (10 mg total) by mouth 2 (two) times daily as needed for muscle spasms. 08/01/13   Marissa Sciacca, PA-C  HYDROcodone-acetaminophen (NORCO/VICODIN) 5-325 MG per tablet Take 1 tablet by mouth every 6 (six) hours as needed for moderate pain or severe pain. 08/01/13   Marissa Sciacca, PA-C  ibuprofen (ADVIL,MOTRIN) 200 MG tablet Take 200 mg by mouth every 8 (eight) hours as needed (pain).     Historical Provider, MD  ibuprofen (ADVIL,MOTRIN) 600 MG tablet Take 1 tablet (600 mg total) by mouth every 6 (six) hours as needed. 08/01/13   Marissa Sciacca, PA-C   BP 110/68  Pulse 79  Temp(Src) 98.6 F (37 C) (Oral)  Ht 5\' 8"  (1.727 m)  Wt 161 lb (73.029 kg)  BMI 24.49 kg/m2  SpO2 99% Physical Exam  Nursing note and vitals reviewed. Constitutional: She is oriented to person, place, and time. She appears well-developed and well-nourished.  HENT:  Head: Normocephalic and atraumatic.  Mouth/Throat: Oropharynx is clear and moist. No oropharyngeal exudate.  Good dentition. No abscess noted.   Eyes: Conjunctivae and EOM are normal. Pupils are equal, round, and reactive to light.  Neck: Normal range of motion.  Prominent right cervical adenopathy that is tender to palpation.   Cardiovascular: Normal rate, regular rhythm and normal heart sounds.  Pulmonary/Chest: Effort normal and breath sounds normal. No respiratory distress. She has no wheezes. She has no rales.  Lymphadenopathy:    She has cervical adenopathy.  Neurological: She is alert and oriented to person, place, and time.  Skin: Skin is warm and dry.  Psychiatric: She has a normal mood and affect. Her behavior is normal.    ED Course  Procedures (including critical care time)  DIAGNOSTIC STUDIES: Oxygen Saturation is 99% on RA, normal by my interpretation.    COORDINATION OF CARE: 9:44 AM Discussed treatment plan with pt at bedside and pt agreed to plan.   Labs Review Labs Reviewed -  No data to display  Imaging Review No results found.   EKG Interpretation None      MDM   Final diagnoses:  Lymphadenitis, acute   9:54 AM Patient appears to have lymphadenitis. Patient will be treated with Percocet and amoxicillin. Patient has stable vitals and is afebrile. Patient instructed to return with worsening or concerning symptoms. No signs of ludwig's angina or deep tissue infection.   I personally performed the services described in this documentation, which was scribed in my presence. The recorded information has been reviewed and is accurate.   Emilia Beck, [place] 09/04/13 [phone]

## 2013-09-04 NOTE — ED Notes (Signed)
Pt reports Rt sided jaw pain and swelling after chewing gum last night. Pain scale 8/10.

## 2013-09-04 NOTE — Discharge Instructions (Signed)
Take Amoxicillin as directed until gone. Take Percocet as needed for pain. Refer to attached documents for more information. Return to the ED with worsening or concerning symptoms.

## 2013-09-12 NOTE — ED Provider Notes (Signed)
Medical screening examination/treatment/procedure(s) were performed by non-physician practitioner and as supervising physician I was immediately available for consultation/collaboration.   EKG Interpretation None        Rolland PorterMark Vegas Coffin, MD 09/12/13 [phone]

## 2013-10-17 ENCOUNTER — Encounter (HOSPITAL_COMMUNITY): Payer: Self-pay | Admitting: Emergency Medicine

## 2013-10-17 ENCOUNTER — Emergency Department (HOSPITAL_COMMUNITY)
Admission: EM | Admit: 2013-10-17 | Discharge: 2013-10-17 | Disposition: A | Discharge status: Home / Self Care | Payer: BC Managed Care – PPO | Attending: Family Medicine | Admitting: Family Medicine

## 2013-10-17 DIAGNOSIS — R269 Unspecified abnormalities of gait and mobility: Secondary | ICD-10-CM | POA: Diagnosis not present

## 2013-10-17 DIAGNOSIS — G8929 Other chronic pain: Secondary | ICD-10-CM | POA: Insufficient documentation

## 2013-10-17 DIAGNOSIS — M545 Low back pain, unspecified: Secondary | ICD-10-CM | POA: Diagnosis not present

## 2013-10-17 DIAGNOSIS — F17200 Nicotine dependence, unspecified, uncomplicated: Secondary | ICD-10-CM | POA: Insufficient documentation

## 2013-10-17 DIAGNOSIS — Z791 Long term (current) use of non-steroidal anti-inflammatories (NSAID): Secondary | ICD-10-CM | POA: Insufficient documentation

## 2013-10-17 MED ORDER — KETOROLAC TROMETHAMINE 30 MG/ML IJ SOLN
30.0000 mg | Freq: Once | INTRAMUSCULAR | Status: AC
  Administered 2013-10-17: 30 mg via INTRAMUSCULAR
  Filled 2013-10-17: qty 1

## 2013-10-17 MED ORDER — CYCLOBENZAPRINE HCL 5 MG PO TABS: 5.0000 mg | ORAL_TABLET | Freq: Three times a day (TID) | ORAL | Status: DC | PRN

## 2013-10-17 MED ORDER — KETOROLAC TROMETHAMINE 10 MG PO TABS: 10.0000 mg | ORAL_TABLET | Freq: Four times a day (QID) | ORAL | Status: DC | PRN

## 2013-10-17 MED ORDER — METHYLPREDNISOLONE ACETATE 40 MG/ML IJ SUSP
80.0000 mg | Freq: Once | INTRAMUSCULAR | Status: AC
  Administered 2013-10-17: 80 mg via INTRAMUSCULAR

## 2013-10-17 NOTE — Discharge Instructions (Signed)
Heat to back and medicine as needed, see neurosurg as advised.

## 2013-10-17 NOTE — ED Provider Notes (Signed)
CSN: [id number]     Arrival date & time 10/17/13  1116 History   First MD Initiated Contact with Patient 10/17/13 1245     Chief Complaint  Patient presents with  . Back Pain     (Consider location/radiation/quality/duration/timing/severity/associated sxs/prior Treatment) Patient is a 38 y.o. female presenting with back pain. The history is provided by the patient.  Back Pain Location:  Lumbar spine Quality:  Stabbing and stiffness Radiates to:  Does not radiate Pain severity:  Moderate Chronicity:  Chronic Context: physical stress and recent injury   Context comment:  Seen in 07/2013 with MRI -neg,just started school with carrying books and sitting., sx worse in low back. Worsened by:  Sitting and bending Associated symptoms: no abdominal pain, no fever, no leg pain, no numbness, no tingling and no weakness     Past Medical History  Diagnosis Date  . Chronic back pain     Takes meds for chronic pain but is out of meds   Past Surgical History  Procedure Laterality Date  . Cholecystectomy    . Tubal ligation     No family history on file. History  Substance Use Topics  . Smoking status: Current Every Day Smoker  . Smokeless tobacco: Not on file  . Alcohol Use: No   OB History   Grav Para Term Preterm Abortions TAB SAB Ect Mult Living                 Review of Systems  Constitutional: Negative.  Negative for fever.  Gastrointestinal: Negative.  Negative for abdominal pain.  Genitourinary: Negative.   Musculoskeletal: Positive for back pain and gait problem. Negative for joint swelling and myalgias.  Skin: Negative.   Neurological: Negative for tingling, weakness and numbness.      Allergies  Shellfish allergy  Home Medications   Prior to Admission medications   Medication Sig Start Date End Date Taking? Authorizing Provider  ibuprofen (ADVIL,MOTRIN) 200 MG tablet Take 800 mg by mouth 3 (three) times daily.    Yes Historical Provider, MD  cyclobenzaprine  (FLEXERIL) 5 MG tablet Take 1 tablet (5 mg total) by mouth 3 (three) times daily as needed for muscle spasms. 10/17/13   Linna Hoff, MD  ketorolac (TORADOL) 10 MG tablet Take 1 tablet (10 mg total) by mouth every 6 (six) hours as needed. For pain 10/17/13   Linna Hoff, MD   BP 112/75  Pulse 79  Temp(Src) 98.2 F (36.8 C) (Oral)  Resp 20  SpO2 100% Physical Exam  Nursing note and vitals reviewed. Constitutional: She is oriented to person, place, and time. She appears well-developed and well-nourished.  Abdominal: Soft. Bowel sounds are normal. There is no tenderness.  Musculoskeletal: She exhibits tenderness.  Neurological: She is alert and oriented to person, place, and time.  Skin: Skin is warm and dry.    ED Course  Procedures (including critical care time) Labs Review Labs Reviewed - No data to display  Imaging Review No results found.   EKG Interpretation None      MDM   Final diagnoses:  Bilateral low back pain without sciatica        Linna Hoff, MD 10/17/13 1321

## 2013-10-17 NOTE — ED Notes (Signed)
Back pain x 3 days has started  School has been walking a lot and carrying books makes it hurt worse had a recent MRI of back

## 2013-10-17 NOTE — ED Notes (Signed)
Patient states she was referred to neurosurgeon the last time she was here, but she did not follow up because she didn't have any insurance at that time.

## 2014-03-25 ENCOUNTER — Emergency Department (HOSPITAL_COMMUNITY): Payer: BLUE CROSS/BLUE SHIELD

## 2014-03-25 ENCOUNTER — Emergency Department (HOSPITAL_COMMUNITY)
Admission: EM | Admit: 2014-03-25 | Discharge: 2014-03-25 | Disposition: A | Discharge status: Home / Self Care | Payer: BLUE CROSS/BLUE SHIELD | Attending: Emergency Medicine | Admitting: Emergency Medicine

## 2014-03-25 ENCOUNTER — Encounter (HOSPITAL_COMMUNITY): Payer: Self-pay | Admitting: Emergency Medicine

## 2014-03-25 DIAGNOSIS — Z9851 Tubal ligation status: Secondary | ICD-10-CM | POA: Insufficient documentation

## 2014-03-25 DIAGNOSIS — R1031 Right lower quadrant pain: Secondary | ICD-10-CM | POA: Insufficient documentation

## 2014-03-25 DIAGNOSIS — G8929 Other chronic pain: Secondary | ICD-10-CM | POA: Diagnosis not present

## 2014-03-25 DIAGNOSIS — Z9049 Acquired absence of other specified parts of digestive tract: Secondary | ICD-10-CM | POA: Diagnosis not present

## 2014-03-25 DIAGNOSIS — Z720 Tobacco use: Secondary | ICD-10-CM | POA: Diagnosis not present

## 2014-03-25 DIAGNOSIS — R112 Nausea with vomiting, unspecified: Secondary | ICD-10-CM | POA: Diagnosis not present

## 2014-03-25 DIAGNOSIS — Z79899 Other long term (current) drug therapy: Secondary | ICD-10-CM | POA: Insufficient documentation

## 2014-03-25 LAB — CBC WITH DIFFERENTIAL/PLATELET
BASOS PCT: 0 % (ref 0–1)
Basophils Absolute: 0 10*3/uL (ref 0.0–0.1)
EOS ABS: 0.1 10*3/uL (ref 0.0–0.7)
Eosinophils Relative: 1 % (ref 0–5)
HCT: 40.9 % (ref 36.0–46.0)
Hemoglobin: 13.4 g/dL (ref 12.0–15.0)
LYMPHS ABS: 2 10*3/uL (ref 0.7–4.0)
LYMPHS PCT: 24 % (ref 12–46)
MCH: 27.8 pg (ref 26.0–34.0)
MCHC: 32.8 g/dL (ref 30.0–36.0)
MCV: 84.9 fL (ref 78.0–100.0)
Monocytes Absolute: 0.8 10*3/uL (ref 0.1–1.0)
Monocytes Relative: 9 % (ref 3–12)
NEUTROS ABS: 5.3 10*3/uL (ref 1.7–7.7)
Neutrophils Relative %: 66 % (ref 43–77)
Platelets: 318 10*3/uL (ref 150–400)
RBC: 4.82 MIL/uL (ref 3.87–5.11)
RDW: 13.5 % (ref 11.5–15.5)
WBC: 8.1 10*3/uL (ref 4.0–10.5)

## 2014-03-25 LAB — COMPREHENSIVE METABOLIC PANEL
ALBUMIN: 3.8 g/dL (ref 3.5–5.2)
ALK PHOS: 62 U/L (ref 39–117)
ALT: 9 U/L (ref 0–35)
ANION GAP: 7 (ref 5–15)
AST: 19 U/L (ref 0–37)
BILIRUBIN TOTAL: 0.5 mg/dL (ref 0.3–1.2)
BUN: 7 mg/dL (ref 6–23)
CO2: 24 mmol/L (ref 19–32)
CREATININE: 0.75 mg/dL (ref 0.50–1.10)
Calcium: 8.9 mg/dL (ref 8.4–10.5)
Chloride: 108 mmol/L (ref 96–112)
GFR calc Af Amer: >90 mL/min (ref >90)
Glucose, Bld: 90 mg/dL (ref 70–99)
Potassium: 3.8 mmol/L (ref 3.5–5.1)
SODIUM: 139 mmol/L (ref 135–145)
Total Protein: 6.5 g/dL (ref 6.0–8.3)

## 2014-03-25 LAB — URINALYSIS, ROUTINE W REFLEX MICROSCOPIC
BILIRUBIN URINE: NEGATIVE
Glucose, UA: NEGATIVE mg/dL
KETONES UR: NEGATIVE mg/dL
Nitrite: NEGATIVE
PROTEIN: NEGATIVE mg/dL
Specific Gravity, Urine: 1.007 (ref 1.005–1.030)
UROBILINOGEN UA: 0.2 mg/dL (ref 0.0–1.0)
pH: 6.5 (ref 5.0–8.0)

## 2014-03-25 LAB — URINE MICROSCOPIC-ADD ON

## 2014-03-25 LAB — LIPASE, BLOOD: Lipase: 25 U/L (ref 11–59)

## 2014-03-25 LAB — I-STAT BETA HCG BLOOD, ED (MC, WL, AP ONLY)

## 2014-03-25 MED ORDER — KETOROLAC TROMETHAMINE 30 MG/ML IJ SOLN
30.0000 mg | Freq: Once | INTRAMUSCULAR | Status: AC
  Administered 2014-03-25: 30 mg via INTRAVENOUS
  Filled 2014-03-25: qty 1

## 2014-03-25 MED ORDER — FENTANYL CITRATE 0.05 MG/ML IJ SOLN
50.0000 ug | Freq: Once | INTRAMUSCULAR | Status: AC
  Administered 2014-03-25: 50 ug via INTRAVENOUS
  Filled 2014-03-25: qty 2

## 2014-03-25 MED ORDER — SODIUM CHLORIDE 0.9 % IV SOLN
1000.0000 mL | Freq: Once | INTRAVENOUS | Status: AC
  Administered 2014-03-25: 1000 mL via INTRAVENOUS

## 2014-03-25 MED ORDER — IOHEXOL 300 MG/ML  SOLN
100.0000 mL | Freq: Once | INTRAMUSCULAR | Status: AC | PRN
  Administered 2014-03-25: 100 mL via INTRAVENOUS

## 2014-03-25 MED ORDER — ONDANSETRON HCL 4 MG/2ML IJ SOLN
4.0000 mg | Freq: Once | INTRAMUSCULAR | Status: AC
  Administered 2014-03-25: 4 mg via INTRAVENOUS
  Filled 2014-03-25: qty 2

## 2014-03-25 MED ORDER — IOHEXOL 300 MG/ML  SOLN
25.0000 mL | Freq: Once | INTRAMUSCULAR | Status: AC | PRN
  Administered 2014-03-25: 25 mL via ORAL

## 2014-03-25 MED ORDER — SODIUM CHLORIDE 0.9 % IV BOLUS (SEPSIS)
1000.0000 mL | Freq: Once | INTRAVENOUS | Status: AC
  Administered 2014-03-25: 1000 mL via INTRAVENOUS

## 2014-03-25 MED ORDER — HYDROMORPHONE HCL 1 MG/ML IJ SOLN
1.0000 mg | Freq: Once | INTRAMUSCULAR | Status: AC
  Administered 2014-03-25: 1 mg via INTRAVENOUS
  Filled 2014-03-25: qty 1

## 2014-03-25 MED ORDER — KETOROLAC TROMETHAMINE 10 MG PO TABS: 10.0000 mg | ORAL_TABLET | Freq: Four times a day (QID) | ORAL | Status: AC | PRN

## 2014-03-25 NOTE — Discharge Instructions (Signed)
As discussed, your evaluation here suggested that you have kidney stones contributing to your abdominal pain.  Please be sure to follow-up with your physician and our urology colleagues  Return here for concerning changes in your condition.

## 2014-03-25 NOTE — ED Provider Notes (Signed)
CSN: [id number]     Arrival date & time 03/25/14  0806 History   First MD Initiated Contact with Patient 03/25/14 [phone]     Chief Complaint  Patient presents with  . Abdominal Pain    HPI  Patient presents with new pain, nausea, vomiting. Pain began 3 days ago, without clear precipitant.  Since onset patient has had sharp pain in the right lower quadrant.  Pain occasionally radiates diffusely. There is associated chills, vomiting, loose/mucousy stool. Patient is currently menstruating. Patient has a history of cholecystectomy, tubal ligation. No relief with anything.   Past Medical History  Diagnosis Date  . Chronic back pain     Takes meds for chronic pain but is out of meds   Past Surgical History  Procedure Laterality Date  . Cholecystectomy    . Tubal ligation     History reviewed. No pertinent family history. History  Substance Use Topics  . Smoking status: Current Every Day Smoker  . Smokeless tobacco: Not on file  . Alcohol Use: No   OB History    No data available     Review of Systems  Constitutional:       Per HPI, otherwise negative  HENT:       Per HPI, otherwise negative  Respiratory:       Per HPI, otherwise negative  Cardiovascular:       Per HPI, otherwise negative  Gastrointestinal: Positive for nausea, vomiting and abdominal pain. Negative for blood in stool.  Endocrine:       Negative aside from HPI  Genitourinary:       Neg aside from HPI   Musculoskeletal:       Per HPI, otherwise negative  Skin: Negative.   Neurological: Negative for syncope.      Allergies  Shellfish allergy  Home Medications   Prior to Admission medications   Medication Sig Start Date End Date Taking? Authorizing Provider  acetaminophen (TYLENOL) 500 MG tablet Take 1,000 mg by mouth every 6 (six) hours as needed (pain).   Yes Historical Provider, MD  albuterol (PROVENTIL HFA;VENTOLIN HFA) 108 (90 BASE) MCG/ACT inhaler Inhale 2 puffs into the lungs every 6  (six) hours as needed for wheezing or shortness of breath.   Yes Historical Provider, MD  cyclobenzaprine (FLEXERIL) 5 MG tablet Take 1 tablet (5 mg total) by mouth 3 (three) times daily as needed for muscle spasms. Patient not taking: Reported on 03/25/2014 10/17/13   Linna HoffJames D Kindl, MD  ketorolac (TORADOL) 10 MG tablet Take 1 tablet (10 mg total) by mouth every 6 (six) hours as needed. For pain Patient not taking: Reported on 03/25/2014 10/17/13   Linna HoffJames D Kindl, MD   BP 104/70 mmHg  Pulse 65  Temp(Src) 98.1 F (36.7 C) (Oral)  Resp 16  Ht 5\' 7"  (1.702 m)  Wt 162 lb (73.483 kg)  BMI 25.37 kg/m2  SpO2 100%  LMP 03/24/2014 (Exact Date) Physical Exam  Constitutional: She is oriented to person, place, and time. She appears well-developed and well-nourished. No distress.  HENT:  Head: Normocephalic and atraumatic.  Eyes: Conjunctivae and EOM are normal.  Cardiovascular: Normal rate and regular rhythm.   Pulmonary/Chest: Effort normal and breath sounds normal. No stridor. No respiratory distress.  Abdominal: She exhibits no distension. There is tenderness in the right lower quadrant. There is tenderness at McBurney's point. There is no rigidity and negative Murphy's sign.  Musculoskeletal: She exhibits no edema.  Neurological: She is alert and oriented  to person, place, and time. No cranial nerve deficit.  Skin: Skin is warm and dry.  Psychiatric: She has a normal mood and affect.  Nursing note and vitals reviewed.   ED Course  Procedures (including critical care time) Labs Review Labs Reviewed  URINALYSIS, ROUTINE W REFLEX MICROSCOPIC - Abnormal; Notable for the following:    Hgb urine dipstick LARGE (*)    Leukocytes, UA TRACE (*)    All other components within normal limits  CBC WITH DIFFERENTIAL/PLATELET  URINE MICROSCOPIC-ADD ON  COMPREHENSIVE METABOLIC PANEL  LIPASE, BLOOD  I-STAT BETA HCG BLOOD, ED ([facility], [facility], [facility] ONLY)    Imaging Review Ct Abdomen Pelvis W  Contrast  03/25/2014   CLINICAL DATA:  RLQ PAIN X 3 DAYS, FEVER, N/V, HX CHOLECYSTECTOMY  EXAM: CT ABDOMEN AND PELVIS WITH CONTRAST  TECHNIQUE: Multidetector CT imaging of the abdomen and pelvis was performed using the standard protocol following bolus administration of intravenous contrast.  CONTRAST:  OMNIPAQUE IOHEXOL 300 MG/ML  SOLN  COMPARISON:  None.  FINDINGS: Normal appendix.  Mild dilation of the right renal collecting system and portions of the right ureter all the way to the pelvis poorly ureter extends along the posterior margin of the right ovary and is poorly defined. Small stone is noted in this general location, which cannot be confidently placed within the ureter. It may reflect phlebolith. There is no ovarian mass. There is no adjacent adnexal inflammation or free fluid.  No renal masses. Left renal collecting system and ureter are normal. No intrarenal stones. Bladder is unremarkable.  Clear lung bases.  Heart normal size.  Liver, spleen, pancreas, adrenal glands:  Normal.  Normal colon and small bowel.  Normal bony structures.  IMPRESSION: 1. Normal appendix. 2. Mild dilation of the right intrarenal collecting system and right ureter. There is a small stone in the right pelvis that could potentially be a ureteral stone but is more likely a phlebolith. The ureter comes in close contact with a normal appearing right ovary. This mild intrarenal collecting system dilation may simply be physiologic. 3. Exam otherwise unremarkable.   Electronically Signed   By: Amie Portland M.D.   On: 03/25/2014 10:01   10:33 AM Patient still in pain  11:51 AM Patient substantially better appearing.  We discussed all findings, including the strong possibility of kidney stone as causative etiology. She'll follow-up with primary care and urology.   MDM  Patient presents with abdominal pain, nausea, and is now tender abdomen on exam.  Patient's CT scan, urinalysis consistent with nephrolithiasis.  No  evidence for acute appendicitis nor other acute GI pathology. Patient improved substantially here, was discharged in stable condition.   Gerhard Munch, MD 03/25/14 [phone]

## 2014-03-25 NOTE — ED Notes (Addendum)
Pt reports RLQ pain nonradiating. Constipation and anorexia. Pt menstruating. Pt reports emesis x 2 at home; VSS.

## 2014-04-19 ENCOUNTER — Other Ambulatory Visit: Payer: Self-pay | Admitting: Urology

## 2014-04-19 DIAGNOSIS — R102 Pelvic and perineal pain: Secondary | ICD-10-CM

## 2014-04-22 ENCOUNTER — Emergency Department (HOSPITAL_COMMUNITY)
Admission: EM | Admit: 2014-04-22 | Discharge: 2014-04-22 | Disposition: A | Discharge status: Home / Self Care | Payer: BLUE CROSS/BLUE SHIELD | Attending: Emergency Medicine | Admitting: Emergency Medicine

## 2014-04-22 ENCOUNTER — Encounter (HOSPITAL_COMMUNITY): Payer: Self-pay | Admitting: Emergency Medicine

## 2014-04-22 DIAGNOSIS — R109 Unspecified abdominal pain: Secondary | ICD-10-CM | POA: Insufficient documentation

## 2014-04-22 DIAGNOSIS — M545 Low back pain, unspecified: Secondary | ICD-10-CM

## 2014-04-22 DIAGNOSIS — R110 Nausea: Secondary | ICD-10-CM | POA: Diagnosis not present

## 2014-04-22 DIAGNOSIS — R55 Syncope and collapse: Secondary | ICD-10-CM | POA: Insufficient documentation

## 2014-04-22 DIAGNOSIS — Z720 Tobacco use: Secondary | ICD-10-CM | POA: Insufficient documentation

## 2014-04-22 DIAGNOSIS — G8929 Other chronic pain: Secondary | ICD-10-CM | POA: Diagnosis not present

## 2014-04-22 DIAGNOSIS — Z79899 Other long term (current) drug therapy: Secondary | ICD-10-CM | POA: Insufficient documentation

## 2014-04-22 LAB — URINALYSIS, ROUTINE W REFLEX MICROSCOPIC
BILIRUBIN URINE: NEGATIVE
Glucose, UA: NEGATIVE mg/dL
KETONES UR: NEGATIVE mg/dL
Nitrite: NEGATIVE
PROTEIN: NEGATIVE mg/dL
SPECIFIC GRAVITY, URINE: 1.005 (ref 1.005–1.030)
UROBILINOGEN UA: 0.2 mg/dL (ref 0.0–1.0)
pH: 6.5 (ref 5.0–8.0)

## 2014-04-22 LAB — COMPREHENSIVE METABOLIC PANEL
ALBUMIN: 4 g/dL (ref 3.5–5.2)
ALT: 13 U/L (ref 0–35)
AST: 16 U/L (ref 0–37)
Alkaline Phosphatase: 58 U/L (ref 39–117)
Anion gap: 7 (ref 5–15)
BUN: 5 mg/dL — ABNORMAL LOW (ref 6–23)
CO2: 23 mmol/L (ref 19–32)
CREATININE: 0.61 mg/dL (ref 0.50–1.10)
Calcium: 9 mg/dL (ref 8.4–10.5)
Chloride: 110 mmol/L (ref 96–112)
GFR calc Af Amer: >90 mL/min (ref >90)
GFR calc non Af Amer: >90 mL/min (ref >90)
Glucose, Bld: 92 mg/dL (ref 70–99)
POTASSIUM: 3.6 mmol/L (ref 3.5–5.1)
SODIUM: 140 mmol/L (ref 135–145)
Total Bilirubin: 0.7 mg/dL (ref 0.3–1.2)
Total Protein: 6.9 g/dL (ref 6.0–8.3)

## 2014-04-22 LAB — CBC WITH DIFFERENTIAL/PLATELET
BASOS ABS: 0 10*3/uL (ref 0.0–0.1)
BASOS PCT: 0 % (ref 0–1)
EOS ABS: 0 10*3/uL (ref 0.0–0.7)
Eosinophils Relative: 0 % (ref 0–5)
HEMATOCRIT: 39.9 % (ref 36.0–46.0)
Hemoglobin: 13 g/dL (ref 12.0–15.0)
Lymphocytes Relative: 22 % (ref 12–46)
Lymphs Abs: 2.2 10*3/uL (ref 0.7–4.0)
MCH: 27.8 pg (ref 26.0–34.0)
MCHC: 32.6 g/dL (ref 30.0–36.0)
MCV: 85.4 fL (ref 78.0–100.0)
MONO ABS: 1 10*3/uL (ref 0.1–1.0)
MONOS PCT: 10 % (ref 3–12)
Neutro Abs: 6.6 10*3/uL (ref 1.7–7.7)
Neutrophils Relative %: 68 % (ref 43–77)
Platelets: 331 10*3/uL (ref 150–400)
RBC: 4.67 MIL/uL (ref 3.87–5.11)
RDW: 13.7 % (ref 11.5–15.5)
WBC: 9.9 10*3/uL (ref 4.0–10.5)

## 2014-04-22 LAB — URINE MICROSCOPIC-ADD ON

## 2014-04-22 LAB — WET PREP, GENITAL
Clue Cells Wet Prep HPF POC: NONE SEEN
TRICH WET PREP: NONE SEEN
Yeast Wet Prep HPF POC: NONE SEEN

## 2014-04-22 LAB — LIPASE, BLOOD: Lipase: 19 U/L (ref 11–59)

## 2014-04-22 MED ORDER — HYDROCODONE-ACETAMINOPHEN 5-325 MG PO TABS: 1.0000 | ORAL_TABLET | Freq: Four times a day (QID) | ORAL | Status: AC | PRN

## 2014-04-22 MED ORDER — FENTANYL CITRATE 0.05 MG/ML IJ SOLN
100.0000 ug | Freq: Once | INTRAMUSCULAR | Status: AC
  Administered 2014-04-22: 100 ug via INTRAVENOUS
  Filled 2014-04-22: qty 2

## 2014-04-22 MED ORDER — NAPROXEN 500 MG PO TABS: 500.0000 mg | ORAL_TABLET | Freq: Two times a day (BID) | ORAL | Status: AC

## 2014-04-22 MED ORDER — ONDANSETRON HCL 4 MG/2ML IJ SOLN
4.0000 mg | Freq: Once | INTRAMUSCULAR | Status: AC
  Administered 2014-04-22: 4 mg via INTRAVENOUS
  Filled 2014-04-22: qty 2

## 2014-04-22 MED ORDER — SODIUM CHLORIDE 0.9 % IV BOLUS (SEPSIS)
1000.0000 mL | Freq: Once | INTRAVENOUS | Status: AC
  Administered 2014-04-22: 1000 mL via INTRAVENOUS

## 2014-04-22 MED ORDER — CYCLOBENZAPRINE HCL 10 MG PO TABS: 10.0000 mg | ORAL_TABLET | Freq: Two times a day (BID) | ORAL | Status: AC | PRN

## 2014-04-22 NOTE — Discharge Instructions (Signed)
Flank Pain °Flank pain refers to pain that is located on the side of the body between the upper abdomen and the back. The pain may occur over a short period of time (acute) or may be long-term or reoccurring (chronic). It may be mild or severe. Flank pain can be caused by many things. °CAUSES  °Some of the more common causes of flank pain include: °· Muscle strains.   °· Muscle spasms.   °· A disease of your spine (vertebral disk disease).   °· A lung infection (pneumonia).   °· Fluid around your lungs (pulmonary edema).   °· A kidney infection.   °· Kidney stones.   °· A very painful skin rash caused by the chickenpox virus (shingles).   °· Gallbladder disease.   °HOME CARE INSTRUCTIONS  °Home care will depend on the cause of your pain. In general, °· Rest as directed by your caregiver. °· Drink enough fluids to keep your urine clear or pale yellow. °· Only take over-the-counter or prescription medicines as directed by your caregiver. Some medicines may help relieve the pain. °· Tell your caregiver about any changes in your pain. °· Follow up with your caregiver as directed. °SEEK IMMEDIATE MEDICAL CARE IF:  °· Your pain is not controlled with medicine.   °· You have new or worsening symptoms. °· Your pain increases.   °· You have abdominal pain.   °· You have shortness of breath.   °· You have persistent nausea or vomiting.   °· You have swelling in your abdomen.   °· You feel faint or pass out.   °· You have blood in your urine. °· You have a fever or persistent symptoms for more than 2-3 days. °· You have a fever and your symptoms suddenly get worse. °MAKE SURE YOU:  °· Understand these instructions. °· Will watch your condition. °· Will get help right away if you are not doing well or get worse. °Document Released: 04/02/2005 Document Revised: 11/04/2011 Document Reviewed: 09/24/2011 °ExitCare® Patient Information ©2015 [employer]. This information is not intended to replace advice given to you by your  health care provider. Make sure you discuss any questions you have with your health care provider. ° ° °Emergency Department Resource Guide °1) Find a Doctor and Pay Out of Pocket °Although you won't have to find out who is covered by your insurance plan, it is a good idea to ask around and get recommendations. You will then need to call the office and see if the doctor you have chosen will accept you as a new patient and what types of options they offer for patients who are self-pay. Some doctors offer discounts or will set up payment plans for their patients who do not have insurance, but you will need to ask so you aren't surprised when you get to your appointment. ° °2) Contact Your Local Health Department °Not all health departments have doctors that can see patients for sick visits, but many do, so it is worth a call to see if yours does. If you don't know where your local health department is, you can check in your phone book. The CDC also has a tool to help you locate your state's health department, and many state websites also have listings of all of their local health departments. ° °3) Find a Walk-in Clinic °If your illness is not likely to be very severe or complicated, you may want to try a walk in clinic. These are popping up all over the country in pharmacies, drugstores, and shopping centers. They're usually staffed by nurse practitioners   or physician assistants that have been trained to treat common illnesses and complaints. They're usually fairly quick and inexpensive. However, if you have serious medical issues or chronic medical problems, these are probably not your best option. ° °No Primary Care Doctor: °- Call [facility] at  832-8000 - they can help you locate a primary care doctor that  accepts your insurance, provides certain services, etc. °- Physician Referral Service- 1-800-533-3463 ° °Chronic Pain Problems: °Organization         Address  Phone   Notes  °[facility]  (336) 297-2271 Patients need to be referred by their primary care doctor.  ° °Medication Assistance: °Organization         Address  Phone   Notes  °[place] [facility] [address] °[place], [place] [address] (336) 641-8030 --Must be a resident of [place] °-- Must have NO insurance coverage whatsoever (no Medicaid/ Medicare, etc.) °-- The pt. MUST have a primary care doctor that directs their care regularly and follows them in the community °  °[facility]  (866) 331-1348   °[employer]  (888) 892-1162   ° °Agencies that provide inexpensive medical care: °Organization         Address  Phone   Notes  °[facility]  (336) 832-8035   °[facility]    (336) 832-7272   °[facility] [address] °[place], [place] [address] (336) 832-4777   °[facility] [address], °[place] (336) 271-4999   °[facility]    (336) 373-0678   °[facility]    (336) 272-1050   °[facility] ° [address], [place] Phone:  (336) 832-4444, Fax:  (336) 832-4440 Hours of Operation:  9 am - 6 pm, M-F.  Also accepts Medicaid/Medicare and self-pay.  °[facility] ° [address], [place] Phone: (336) 832-3150, Fax: (336) 832-3151. Hours of Operation:  8:30 am - 5:30 pm, M-F.  Also accepts Medicaid and self-pay.  °[facility] [place] [address], [place] Phone: (336) 878-6027   °[facility] [address], [place], [place] (336)723-1848, Ext. 123 Mondays & Thursdays: 7-9 AM.  First 15 patients are seen on a first come, first serve basis. °  ° °Medicaid-accepting [place] Providers: ° °Organization         Address  Phone   Notes  °[facility] [address], [place] (336) 641-2100 Also accepts self-pay patients.  °[facility] [address],  [place] ° (336) 856-9996   °[facility] [address], [place] (336) 288-8857   °[facility] [address] [place] [address], [place] (336) 299-7000   °Veita Bland [address], [place]  ° (336) 373-1557 Only accepts [place] Access Medicaid patients after they have their name applied to their card.  ° °Self-Pay (no insurance) in [place]: ° °Organization         Address  Phone   Notes  °Sickle Cell Patients, [facility] [address], [place] (336) 832-1970   °[facility] [address],  (336) 832-4400   °[facility] [place] ° [address], [place] (336) 992-4800   °[facility]/Dr. Osei-Bonsu ° [address]   [place] [address], [place] or [address], [place] (336) 841-8500 Phone number for both [place] and [place] locations is the same.  °[facility] [address], [place] (336) 299-0000   °[facility] [address] [place] [address], [place] or [address] (336) 852-7530 °(336) 878-2260   °[facility] [address], [place] (336) 350-1642, phone; (336) 294-5005, fax Sees patients 1st and 3rd Saturday of every month.  Must not qualify for public or private insurance (i.e. Medicaid, Medicare, [place] Health Choice, Veterans' Benefits) • Household income should be no more than 200% of the poverty level •The clinic cannot treat you if you are pregnant or think you are pregnant • Sexually transmitted diseases are not treated at the clinic.  ° ° °Dental Care: °Organization         Address  Phone  Notes  °[facility] [address], [place] (336) 641-6152 Accepts children up to age 21 who are enrolled in Medicaid or [place] Health Choice; pregnant women with a Medicaid card; and children who have applied for Medicaid or [place] Health  Choice, but were declined, whose parents can pay a reduced fee at time of service.  °[facility] [place]  [address], [place] (336) 641-7733 Accepts children up to age 21 who are enrolled in Medicaid or [place] Health Choice; pregnant women with a Medicaid card; and children who have applied for Medicaid or [place] Health Choice, but were declined, whose parents can pay a reduced fee at time of service.  °[facility] ° [address], [place] (336) 641-4533 Patients are seen by appointment only. Walk-ins are not accepted. [facility] will see patients 18 years of age and older. °Monday - Tuesday (8am-5pm) °Most Wednesdays (8:30-5pm) °$30 per visit, cash only  °[facility] ° [address], [place] (336) 641-4533 Patients are seen by appointment only. Walk-ins are not accepted. [facility] will see patients 18 years of age and older. °One Wednesday Evening (Monthly: Volunteer Based).  $30 per visit, cash only  °[facility]  (919) 537-3737 for adults; Children under age 4, call Graduate Pediatric Dentistry at (919) [phone]. Children aged 4-14, please call (919) [phone] to request a pediatric application. ° Dental services are provided in all areas of dental care including fillings, crowns and bridges, complete and partial dentures, implants, gum treatment, root canals, and extractions. Preventive care is also provided. Treatment is provided to both adults and children. °Patients are selected via a lottery and there is often a waiting list. °  °[facility] [address], °[place] ° (336) 763-8833 [email or web address] °  °[facility] [address], [place], [place] (336)723-1848, Ext. 123 Second and Fourth Thursday of each month, opens at 6:30 AM; Clinic ends at 9 AM.  Patients are seen on a first-come first-served basis, and a limited number are seen during each  clinic.  ° °[facility] ° [address], [place],  (336) 723-7904   Eligibility Requirements °You must have lived in [place], [place], or [place] counties for at least the last three months. °  You cannot be eligible for state or federal sponsored healthcare insurance, including Veterans Administration, Medicaid, or Medicare. °  You generally cannot be eligible for healthcare insurance through your employer.  °    How to apply: °Eligibility screenings are held every Tuesday and Wednesday afternoon from 1:00 pm until 4:00 pm. You do not need an appointment for the interview!  °[facility] [address], [place], [place] 336-631-2330   °[facility]  336-342-8273   °[facility]  336-703-3100   °[facility]  336-570-6415   ° °Behavioral Health Resources in the Community: °Intensive Outpatient Programs °Organization         Address  Phone  Notes  °[facility] [address], [place], [place] 336-878-6098   °[facility] [address], [place], [place] 336-832-9800   °[facility] [address], [place], [place] ° 336-882-2125   °[facility] [address],  °[place], [place] 1-800-853-5163 or 336-641-4981   °Substance Abuse Resources °Organization         Address  Phone  Notes  °[facility]  336-882-2125   °[facility]  336-784-9470   °[facility]  336-285-9073   °[facility]  336-845-3988   °Residential & Outpatient Substance Abuse Program  1-800-659-3381   °Psychological Services °Organization         Address  Phone  Notes  °[facility]  336- 832-9600   °[facility]  336- 378-7881   °[facility] [address], [place] 1-800-853-5163 or 336-641-4981   ° °Mobile Crisis Teams °Organization         Address  Phone  Notes  °[facility], Mobile  Crisis Care Unit  1-877-626-1772   °Assertive °[facility] ° [address] [place], [place] 336-834-9664   °Sharon DeEsch [address] °[place] 336-554-5454   ° °Self-Help/Support Groups °Organization         Address  Phone             Notes  °[facility] [place] - variety of support groups  336- 373-1402 Call for more information  °Narcotics Anonymous (NA), [facility] [address], °[place]  2 meetings at this location  ° °Residential Treatment Programs °Organization         Address  Phone  Notes  °[facility] [address],    °[place]  1-866-801-8205   °[facility] ° [address], [place], [place] 704-293-8524   °[facility] [address], [place] 336-845-3988 Admissions: 8am-3pm M-F  °[facility] [address]    °[place], [place] 336-841-1104   °[facility] [address], [place], [place] 336-379-7146   °[facility] [address]  °[place], [place] 336-285-9073   °[facility] - Intensive Outpatient [address], , [place] 336-852-3033   °[facility] [address]  °[place], [place] 1-877-615-2722 or 336-784-9470   °[facility] [address] [place], [place] 336-227-7417 Accepts Medicaid  °[facility] [address]  ° [place] 1-800-659-3381 Substance Abuse/Addiction Treatment  ° °[place] Behavioral Health Resources °Organization         Address  Phone  Notes  °[facility]  (888) 581-9988   °Julie Brannon, PhD [address] [place], [place]   (336) 349-5553 or (336) 951-0000   °[facility]   [address] °[place],  (336) 349-4454   °[facility] [address],   [place], [place] (336) 342-8316 Insurance/Medicaid/sponsorship through [facility]  °[facility] [address]                                    [place], [place] (336) 342-8316 Therapy/tele-psych/case  °[facility] [address]  ° [place], [place] (336) 349-2233    °Dr. Arfeen  (336) 349-4544   °[facility] [place]  [employer] [place] [facility] 1) [address], [place] °2) [address], [place] °3)  [address], [place] (336) 349-3220 °(336) 342-7768 ° °(336) 342-8140   °[place] [facility] (336) 342-1394 or (336) 342-3537 (After Hours)    ° ° ° °

## 2014-04-22 NOTE — ED Provider Notes (Signed)
CSN: [id number]     Arrival date & time 04/22/14  1520 History   First MD Initiated Contact with Patient 04/22/14 1527     Chief Complaint  Patient presents with  . Flank Pain  . Loss of Consciousness   HPI  Patient is a 39 year old female with past medical history of chronic back pain who presents to the emergency room for evaluation of left-sided flank pain and loss of consciousness. Patient states that this morning she developed very severe left-sided back pain in her flank which was constant and sharp stabbing sensation. She said this pain was very severe. She tried ibuprofen at home with little relief. She states that she attempted to stand up and passed out and landed on her bed. She woke up nearly immediately. She states that she used to faint when she was a teenager but has not done so anytime recently. She states that she has had nausea with no vomiting. She denies urinary symptoms. She denies fever, chills, vomiting, chest pain, shortness of breath, diarrhea, constipation, melena, hematochezia.  Patient states that moving makes her pain worse. Nothing seems to make her pain any better. Patient received 30 mg of Toradol in route which has not helped.  Past Medical History  Diagnosis Date  . Chronic back pain     Takes meds for chronic pain but is out of meds   Past Surgical History  Procedure Laterality Date  . Cholecystectomy    . Tubal ligation     No family history on file. History  Substance Use Topics  . Smoking status: Current Every Day Smoker  . Smokeless tobacco: Not on file  . Alcohol Use: No   OB History    No data available     Review of Systems  Constitutional: Negative for fever, chills and fatigue.  Respiratory: Negative for cough, chest tightness, shortness of breath and wheezing.   Cardiovascular: Negative for chest pain, palpitations and leg swelling.  Gastrointestinal: Positive for nausea. Negative for vomiting, abdominal pain, diarrhea and constipation.   Genitourinary: Positive for flank pain. Negative for dysuria, urgency, frequency, hematuria and difficulty urinating.  Musculoskeletal: Positive for back pain. Negative for gait problem.  Neurological: Negative for dizziness and numbness.  All other systems reviewed and are negative.     Allergies  Shellfish allergy  Home Medications   Prior to Admission medications   Medication Sig Start Date End Date Taking? Authorizing Provider  albuterol (PROVENTIL HFA;VENTOLIN HFA) 108 (90 BASE) MCG/ACT inhaler Inhale 2 puffs into the lungs every 6 (six) hours as needed for wheezing or shortness of breath.   Yes Historical Provider, MD  ibuprofen (ADVIL,MOTRIN) 200 MG tablet Take 800 mg by mouth every 6 (six) hours as needed for moderate pain.   Yes Historical Provider, MD  metroNIDAZOLE (FLAGYL) 500 MG tablet Take 2,000 mg by mouth once.   Yes Historical Provider, MD  cyclobenzaprine (FLEXERIL) 10 MG tablet Take 1 tablet (10 mg total) by mouth 2 (two) times daily as needed for muscle spasms. 04/22/14   Leighana Neyman A Forcucci, PA-C  HYDROcodone-acetaminophen (NORCO/VICODIN) 5-325 MG per tablet Take 1 tablet by mouth every 6 (six) hours as needed. 04/22/14   Dolph Tavano A Forcucci, PA-C  ketorolac (TORADOL) 10 MG tablet Take 1 tablet (10 mg total) by mouth every 6 (six) hours as needed. For pain Patient not taking: Reported on 04/22/2014 03/25/14   Gerhard Munch, MD  naproxen (NAPROSYN) 500 MG tablet Take 1 tablet (500 mg total) by mouth 2 (  two) times daily. 04/22/14   Jaylene Schrom A Forcucci, PA-C   BP 111/64 mmHg  Pulse 78  Temp(Src) 97.6 F (36.4 C) (Oral)  Resp 13  SpO2 100%  LMP 04/22/2014 Physical Exam  Constitutional: She is oriented to person, place, and time. She appears well-developed and well-nourished. No distress.  HENT:  Head: Normocephalic and atraumatic.  Mouth/Throat: Oropharynx is clear and moist. No oropharyngeal exudate.  Eyes: Conjunctivae and EOM are normal. Pupils are equal, round,  and reactive to light. No scleral icterus.  Neck: Normal range of motion. Neck supple. No JVD present. No thyromegaly present.  Cardiovascular: Normal rate, regular rhythm and intact distal pulses.  Exam reveals no gallop and no friction rub.   No murmur heard. Pulmonary/Chest: Effort normal and breath sounds normal. No respiratory distress. She has no wheezes. She has no rales. She exhibits no tenderness.  Abdominal: Soft. Bowel sounds are normal. She exhibits no distension and no mass. There is no tenderness. There is no rebound and no guarding.  Genitourinary: Uterus normal. No labial fusion. There is no rash, tenderness, lesion or injury on the right labia. There is no rash, tenderness, lesion or injury on the left labia. Uterus is not deviated, not enlarged, not fixed and not tender. Cervix exhibits no motion tenderness, no discharge and no friability. Right adnexum displays no mass, no tenderness and no fullness. Left adnexum displays no mass, no tenderness and no fullness. There is bleeding in the vagina. No erythema or tenderness in the vagina. No foreign body around the vagina. No signs of injury around the vagina. No vaginal discharge found.  Blood in the vaginal vault  Musculoskeletal:  Patient rises slowly from sitting to standing.  They walk without an antalgic gait.  There is no evidence of erythema, ecchymosis, or gross deformity.  There is tenderness to palpation over lumbar bony spine and left thoracic and lumbar paraspinal muscles.  Active ROM is limited due to pain.  Sensation to light touch is intact over all extremities.  Strength is symmetric and equal in all extremities.    Lymphadenopathy:    She has no cervical adenopathy.  Neurological: She is alert and oriented to person, place, and time. She has normal strength. No cranial nerve deficit or sensory deficit. Coordination normal.  Skin: Skin is warm and dry. She is not diaphoretic.  Psychiatric: She has a normal mood and  affect. Her behavior is normal. Judgment and thought content normal.  Nursing note and vitals reviewed.   ED Course  Procedures (including critical care time) Labs Review Labs Reviewed  WET PREP, GENITAL - Abnormal; Notable for the following:    WBC, Wet Prep HPF POC FEW (*)    All other components within normal limits  COMPREHENSIVE METABOLIC PANEL - Abnormal; Notable for the following:    BUN 5 (*)    All other components within normal limits  URINALYSIS, ROUTINE W REFLEX MICROSCOPIC - Abnormal; Notable for the following:    Hgb urine dipstick TRACE (*)    Leukocytes, UA TRACE (*)    All other components within normal limits  CBC WITH DIFFERENTIAL/PLATELET  LIPASE, BLOOD  URINE MICROSCOPIC-ADD ON  RPR  HIV ANTIBODY (ROUTINE TESTING)  GC/CHLAMYDIA PROBE AMP ([place])    Imaging Review No results found.   EKG Interpretation None      MDM   Final diagnoses:  Left flank pain  Left-sided low back pain without sciatica   Patient is a 39 year old female who presents emergency room  for evaluation of left flank pain. On evaluation patient is tender on the left thoracic and lumbar paraspinal muscles and lumbar spine. Abdomen soft and nontender with no evidence of guarding, rigidity, or peritoneal signs. Patient is alert and nontoxic-appearing. Vital signs are normal. Wet prep is unremarkable. There is no cervical motion tenderness or adnexal tenderness or fullness on examination. CBC, CMP, urinalysis, lipase are all negative for acute abnormalities. HIV, RPR, and GC chlamydia is currently pending. Patient recently had CT scan of the abdomen and pelvis on 03/25/2014 which showed no evidence of left-sided kidney stones. Doubt UTI given urinalysis results. Do not suspect pyelonephritis at this time. I believe pain is musculoskeletal in nature. We'll discharge the patient home and treat symptomatically with anti-inflammatory medications, hydrocodone, and Flexeril. Patient return for  worsening active pain or symptoms of cauda equina including loss of bowel or bladder, saddle anesthesias, or inability to maintain balance. Patient has follow-up this week with OB/GYN and with urology. Patient states understanding and agreement with the above workup and plan. Patient seen by and discussed with Dr. Adriana Simas who agrees the above workup and plan.    Eben Burow, PA-C 04/22/14 1926  Donnetta Hutching, MD 04/23/14 0001

## 2014-04-22 NOTE — ED Notes (Addendum)
Per EMS, Pt from home c/o L side flank pain x 3 days. Pt had CT a month ago and was diagnosed with a kidney stone. Pt has not had medication filled due to financial issues.  Pt sts she stood up today and got lightheaded, had syncopal episode due to the pain. A&Ox4. Pt came to and called 911 herself. Pt fell onto bed. Pt has hx of ovarian cyst, upon palpation pt c/o pain in left pelvic area that radiates into back. Pt currently on period. Pt given 30mg  Toradol and 4mg  Zofran en route.

## 2014-04-22 NOTE — ED Notes (Signed)
Bed: WA06 Expected date:  Expected time:  Means of arrival:  Comments: ?kidney stone

## 2014-04-23 LAB — GC/CHLAMYDIA PROBE AMP (~~LOC~~) NOT AT ARMC
Chlamydia: NEGATIVE
Neisseria Gonorrhea: NEGATIVE

## 2014-04-23 LAB — RPR: RPR Ser Ql: NONREACTIVE

## 2014-04-23 LAB — HIV ANTIBODY (ROUTINE TESTING W REFLEX): HIV SCREEN 4TH GENERATION: NONREACTIVE

## 2014-04-27 ENCOUNTER — Ambulatory Visit (HOSPITAL_COMMUNITY): Admission: RE | Admit: 2014-04-27 | Payer: BLUE CROSS/BLUE SHIELD | Source: Ambulatory Visit

## 2014-05-08 ENCOUNTER — Encounter (HOSPITAL_COMMUNITY): Payer: Self-pay | Admitting: *Deleted

## 2014-05-08 ENCOUNTER — Emergency Department (HOSPITAL_COMMUNITY)
Admission: EM | Admit: 2014-05-08 | Discharge: 2014-05-08 | Disposition: A | Discharge status: Home / Self Care | Payer: BLUE CROSS/BLUE SHIELD | Attending: Emergency Medicine | Admitting: Emergency Medicine

## 2014-05-08 ENCOUNTER — Emergency Department (HOSPITAL_COMMUNITY): Payer: BLUE CROSS/BLUE SHIELD

## 2014-05-08 DIAGNOSIS — K5900 Constipation, unspecified: Secondary | ICD-10-CM | POA: Insufficient documentation

## 2014-05-08 DIAGNOSIS — Z720 Tobacco use: Secondary | ICD-10-CM | POA: Diagnosis not present

## 2014-05-08 DIAGNOSIS — R112 Nausea with vomiting, unspecified: Secondary | ICD-10-CM | POA: Diagnosis not present

## 2014-05-08 DIAGNOSIS — K644 Residual hemorrhoidal skin tags: Secondary | ICD-10-CM | POA: Diagnosis not present

## 2014-05-08 DIAGNOSIS — Z3202 Encounter for pregnancy test, result negative: Secondary | ICD-10-CM | POA: Insufficient documentation

## 2014-05-08 DIAGNOSIS — G8929 Other chronic pain: Secondary | ICD-10-CM | POA: Insufficient documentation

## 2014-05-08 DIAGNOSIS — Z791 Long term (current) use of non-steroidal anti-inflammatories (NSAID): Secondary | ICD-10-CM | POA: Insufficient documentation

## 2014-05-08 DIAGNOSIS — R1084 Generalized abdominal pain: Secondary | ICD-10-CM | POA: Diagnosis present

## 2014-05-08 DIAGNOSIS — Z79899 Other long term (current) drug therapy: Secondary | ICD-10-CM | POA: Insufficient documentation

## 2014-05-08 DIAGNOSIS — R109 Unspecified abdominal pain: Secondary | ICD-10-CM

## 2014-05-08 LAB — CBC WITH DIFFERENTIAL/PLATELET
Basophils Absolute: 0 10*3/uL (ref 0.0–0.1)
Basophils Relative: 0 % (ref 0–1)
EOS PCT: 1 % (ref 0–5)
Eosinophils Absolute: 0.1 10*3/uL (ref 0.0–0.7)
HCT: 41.6 % (ref 36.0–46.0)
Hemoglobin: 13.7 g/dL (ref 12.0–15.0)
LYMPHS ABS: 2.4 10*3/uL (ref 0.7–4.0)
Lymphocytes Relative: 22 % (ref 12–46)
MCH: 27.7 pg (ref 26.0–34.0)
MCHC: 32.9 g/dL (ref 30.0–36.0)
MCV: 84.2 fL (ref 78.0–100.0)
Monocytes Absolute: 0.8 10*3/uL (ref 0.1–1.0)
Monocytes Relative: 7 % (ref 3–12)
Neutro Abs: 7.8 10*3/uL — ABNORMAL HIGH (ref 1.7–7.7)
Neutrophils Relative %: 70 % (ref 43–77)
PLATELETS: 345 10*3/uL (ref 150–400)
RBC: 4.94 MIL/uL (ref 3.87–5.11)
RDW: 13.6 % (ref 11.5–15.5)
WBC: 11.1 10*3/uL — ABNORMAL HIGH (ref 4.0–10.5)

## 2014-05-08 LAB — COMPREHENSIVE METABOLIC PANEL
ALK PHOS: 56 U/L (ref 39–117)
ALT: 11 U/L (ref 0–35)
AST: 16 U/L (ref 0–37)
Albumin: 4.2 g/dL (ref 3.5–5.2)
Anion gap: 8 (ref 5–15)
BILIRUBIN TOTAL: 0.6 mg/dL (ref 0.3–1.2)
BUN: 10 mg/dL (ref 6–23)
CHLORIDE: 104 mmol/L (ref 96–112)
CO2: 25 mmol/L (ref 19–32)
Calcium: 9.3 mg/dL (ref 8.4–10.5)
Creatinine, Ser: 0.81 mg/dL (ref 0.50–1.10)
GFR calc non Af Amer: >90 mL/min (ref >90)
Glucose, Bld: 84 mg/dL (ref 70–99)
POTASSIUM: 4.1 mmol/L (ref 3.5–5.1)
SODIUM: 137 mmol/L (ref 135–145)
Total Protein: 6.8 g/dL (ref 6.0–8.3)

## 2014-05-08 LAB — URINALYSIS, ROUTINE W REFLEX MICROSCOPIC
Bilirubin Urine: NEGATIVE
Glucose, UA: NEGATIVE mg/dL
Hgb urine dipstick: NEGATIVE
Ketones, ur: NEGATIVE mg/dL
Leukocytes, UA: NEGATIVE
Nitrite: NEGATIVE
Protein, ur: NEGATIVE mg/dL
Specific Gravity, Urine: 1.005 (ref 1.005–1.030)
Urobilinogen, UA: 0.2 mg/dL (ref 0.0–1.0)
pH: 7.5 (ref 5.0–8.0)

## 2014-05-08 LAB — LIPASE, BLOOD: Lipase: 28 U/L (ref 11–59)

## 2014-05-08 LAB — POC URINE PREG, ED: Preg Test, Ur: NEGATIVE

## 2014-05-08 MED ORDER — SORBITOL 70 % SOLN
960.0000 mL | TOPICAL_OIL | Freq: Once | ORAL | Status: AC
  Administered 2014-05-08: 960 mL via RECTAL
  Filled 2014-05-08 (×2): qty 240

## 2014-05-08 MED ORDER — SODIUM CHLORIDE 0.9 % IV BOLUS (SEPSIS)
1000.0000 mL | Freq: Once | INTRAVENOUS | Status: AC
  Administered 2014-05-08: 1000 mL via INTRAVENOUS

## 2014-05-08 MED ORDER — POLYETHYLENE GLYCOL 3350 17 G PO PACK: 17.0000 g | PACK | Freq: Every day | ORAL | Status: AC

## 2014-05-08 MED ORDER — PROMETHAZINE HCL 25 MG PO TABS
25.0000 mg | ORAL_TABLET | Freq: Once | ORAL | Status: AC
  Administered 2014-05-08: 25 mg via ORAL
  Filled 2014-05-08 (×2): qty 1

## 2014-05-08 MED ORDER — PROMETHAZINE HCL 25 MG PO TABS: 25.0000 mg | ORAL_TABLET | Freq: Four times a day (QID) | ORAL | Status: AC | PRN

## 2014-05-08 NOTE — ED Notes (Signed)
CT notified that pt has IV, will be transported to CT after xray per Selena BattenKim, radiologist tech

## 2014-05-08 NOTE — ED Notes (Signed)
Pt had a bowel movement after the enema 

## 2014-05-08 NOTE — ED Notes (Signed)
Pt left for Xray.

## 2014-05-08 NOTE — ED Provider Notes (Signed)
CSN: [id number]     Arrival date & time 05/08/14  0756 History   First MD Initiated Contact with Patient 05/08/14 1056     Chief Complaint  Patient presents with  . Abdominal Pain  . Emesis     (Consider location/radiation/quality/duration/timing/severity/associated sxs/prior Treatment) HPI  Pt is a 39yo female presenting to ED with c/o gradually worsening generalized abdominal pain, cramping and sharp, 8/10 at worst. States she has not had a BM in 8 days despite using milk of magnesium, prune juice and apple juice.  Pt states she has nausea and vomited twice this morning. Reports she feels like she is bloating.  Denies recent use of narcotic pain medication. No previous hx of SBO. Abdominal surgical hx significant for cholecystectomy and tubal ligation. Denies fever or chills. No sick contacts or recent travel. No urinary or vaginal symptoms.    Past Medical History  Diagnosis Date  . Chronic back pain     Takes meds for chronic pain but is out of meds   Past Surgical History  Procedure Laterality Date  . Cholecystectomy    . Tubal ligation     No family history on file. History  Substance Use Topics  . Smoking status: Current Every Day Smoker  . Smokeless tobacco: Not on file  . Alcohol Use: No   OB History    No data available     Review of Systems  Constitutional: Negative for fever and chills.  Respiratory: Negative for cough and shortness of breath.   Gastrointestinal: Positive for nausea, vomiting, abdominal pain ( diffuse), constipation, abdominal distention ( bloating) and rectal pain. Negative for diarrhea, blood in stool and anal bleeding.  Genitourinary: Negative for dysuria, urgency, frequency, hematuria, flank pain, decreased urine volume, vaginal bleeding, vaginal discharge, vaginal pain, menstrual problem and pelvic pain.  Musculoskeletal: Negative for myalgias and back pain.  All other systems reviewed and are negative.     Allergies  Shellfish  allergy  Home Medications   Prior to Admission medications   Medication Sig Start Date End Date Taking? Authorizing Provider  albuterol (PROVENTIL HFA;VENTOLIN HFA) 108 (90 BASE) MCG/ACT inhaler Inhale 2 puffs into the lungs every 6 (six) hours as needed for wheezing or shortness of breath.    Historical Provider, MD  cyclobenzaprine (FLEXERIL) 10 MG tablet Take 1 tablet (10 mg total) by mouth 2 (two) times daily as needed for muscle spasms. 04/22/14   Courtney Forcucci, PA-C  HYDROcodone-acetaminophen (NORCO/VICODIN) 5-325 MG per tablet Take 1 tablet by mouth every 6 (six) hours as needed. 04/22/14   Courtney Forcucci, PA-C  ibuprofen (ADVIL,MOTRIN) 200 MG tablet Take 800 mg by mouth every 6 (six) hours as needed for moderate pain.    Historical Provider, MD  ketorolac (TORADOL) 10 MG tablet Take 1 tablet (10 mg total) by mouth every 6 (six) hours as needed. For pain Patient not taking: Reported on 04/22/2014 03/25/14   Gerhard Munchobert Lockwood, MD  metroNIDAZOLE (FLAGYL) 500 MG tablet Take 2,000 mg by mouth once.    Historical Provider, MD  naproxen (NAPROSYN) 500 MG tablet Take 1 tablet (500 mg total) by mouth 2 (two) times daily. 04/22/14   Courtney Forcucci, PA-C  polyethylene glycol (MIRALAX / GLYCOLAX) packet Take 17 g by mouth daily. 05/08/14   Junius FinnerErin O'Malley, PA-C  promethazine (PHENERGAN) 25 MG tablet Take 1 tablet (25 mg total) by mouth every 6 (six) hours as needed for nausea or vomiting. 05/08/14   Junius FinnerErin O'Malley, PA-C   BP 106/63 mmHg  Pulse 71  Temp(Src) 97.7 F (36.5 C)  Resp 16  Ht  (1.727 m)  Wt 162 lb (73.483 kg)  BMI 24.64 kg/m2  SpO2 100%  LMP 04/30/2014 Physical Exam  Constitutional: She appears well-developed and well-nourished. No distress.  HENT:  Head: Normocephalic and atraumatic.  Eyes: Conjunctivae are normal. No scleral icterus.  Neck: Normal range of motion.  Cardiovascular: Normal rate, regular rhythm and normal heart sounds.   Pulmonary/Chest: Effort normal and  breath sounds normal. No respiratory distress. She has no wheezes. She has no rales. She exhibits no tenderness.  Abdominal: Soft. Bowel sounds are normal. She exhibits no distension and no mass. There is tenderness. There is no rebound and no guarding.  Normal bowel sounds, soft, non-distended, diffuse tenderness w/o rebound, guarding, masses, or CVAT  Genitourinary: Rectal exam shows external hemorrhoid. Rectal exam shows no internal hemorrhoid, no mass, no tenderness and anal tone normal.  Chaperoned exam. Normal rectal tone, no fecal impaction.  Musculoskeletal: Normal range of motion.  Neurological: She is alert.  Skin: Skin is warm and dry. She is not diaphoretic.  Nursing note and vitals reviewed.   ED Course  Procedures (including critical care time) Labs Review Labs Reviewed  CBC WITH DIFFERENTIAL/PLATELET - Abnormal; Notable for the following:    WBC 11.1 (*)    Neutro Abs 7.8 (*)    All other components within normal limits  COMPREHENSIVE METABOLIC PANEL  LIPASE, BLOOD  URINALYSIS, ROUTINE W REFLEX MICROSCOPIC  POC URINE PREG, ED    Imaging Review Dg Abd Acute W/chest  05/08/2014   CLINICAL DATA:  Abdominal pain  EXAM: ACUTE ABDOMEN SERIES (ABDOMEN 2 VIEW & CHEST 1 VIEW)  COMPARISON:  None.  FINDINGS: Cardiac shadow is within normal limits. The lungs are clear bilaterally. No acute bony abnormality is noted in the chest.  Scattered large and small bowel gas is noted. A significant amount of retained fecal material is noted throughout the colon. No obstructive changes are seen. No acute bony abnormality is noted.  IMPRESSION: Significant retained fecal material without obstructive change. No other focal abnormality is noted.   Electronically Signed   By: Alcide Clever M.D.   On: 05/08/2014 12:11     EKG Interpretation None      MDM   Final diagnoses:  Abdominal pain  Constipation  Non-intractable vomiting with nausea, vomiting of unspecified type    Pt is a 39yo  female c/o abdominal pain, no BM in 8 days. Abdominal surgical hx of cholecystectomy and tubal ligation. No previous hx of SBO. No urinary or vaginal symptoms. On rectal exam, no fecal impaction.  Pt is afebrile, no focal tenderness. CT abd 2 months ago, normal appendix.    Labs: unremarkable.  Acute abd series: significant for significant retained fecal material w/o obstructive change. No other focal abnormality noted.  Pt was given SMOG enema in ED.  2:18 PM Pt had a moderate sized BM after enema.  Pt states her pain resolved shortly after having the BM and she feels much better. Abdomen: soft, non-tender.  Not concerned for surgical abdomen. Pt states she feels safe and is agreeable with discharge home.   Rx: miralax and phenergan. Home care instructions for constipation provided. Advised to f/u with PCP as needed for recheck of symptoms in 2-3 days if not improving. Return precautions provided. Pt verbalized understanding and agreement with tx plan.    Junius Finner, PA-C 05/08/14 1636  Tilden Fossa, MD 05/08/14 [phone]

## 2014-05-08 NOTE — ED Notes (Signed)
NAD at this time. Pt is stable and going home.  

## 2014-05-08 NOTE — ED Notes (Signed)
Pt states that she has been constipated for 8 days report generalized abdominal pain and began vomiting this morning. Pt states that she has vomited twice this morning.

## 2015-04-04 IMAGING — CR DG ABDOMEN ACUTE W/ 1V CHEST
3 series · 3 of 3 positions shown · non-contrast
Comparison: None.

CLINICAL DATA: Abdominal pain

EXAM:
ACUTE ABDOMEN SERIES (ABDOMEN 2 VIEW & CHEST 1 VIEW)

[chest pa]
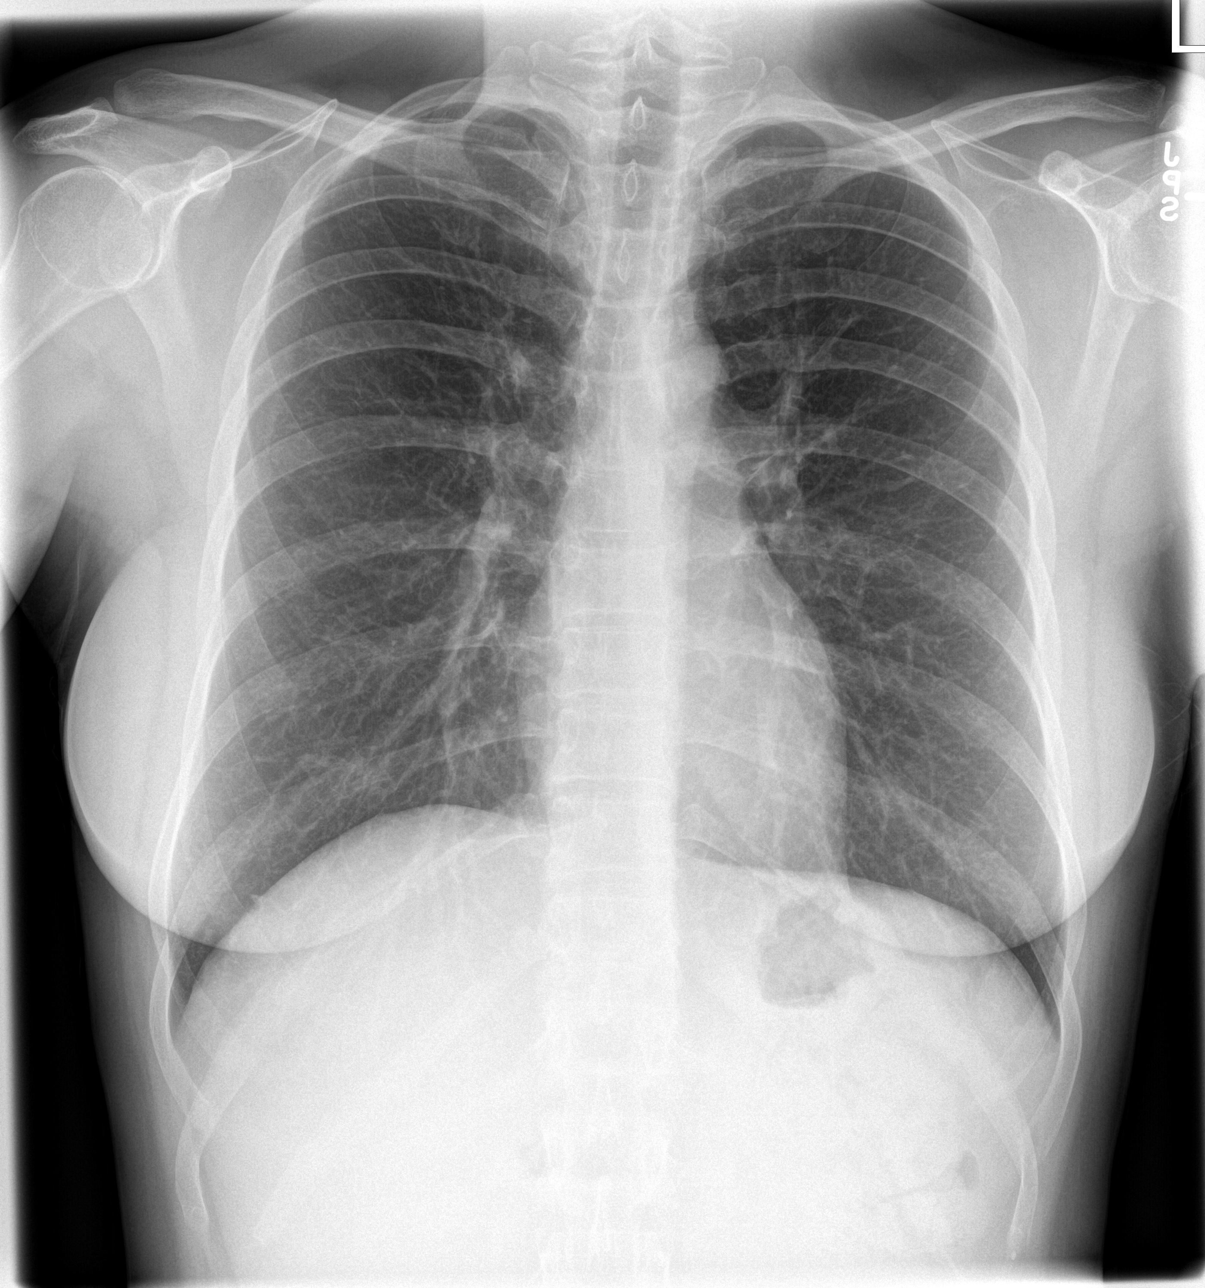

[abdomen erect]
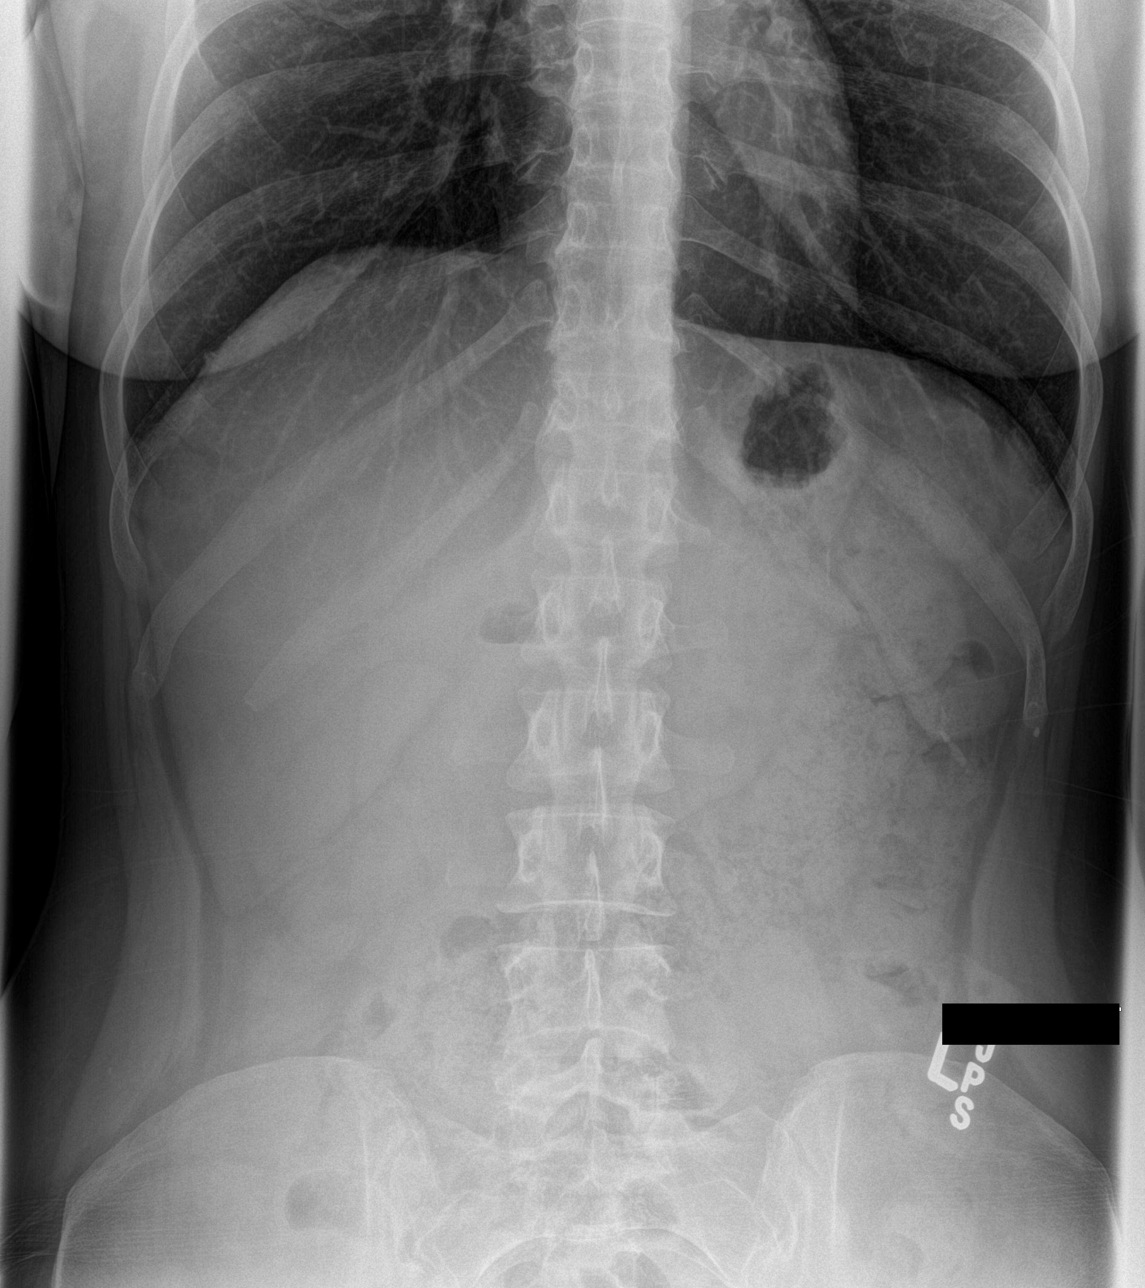

[abdomen supine]
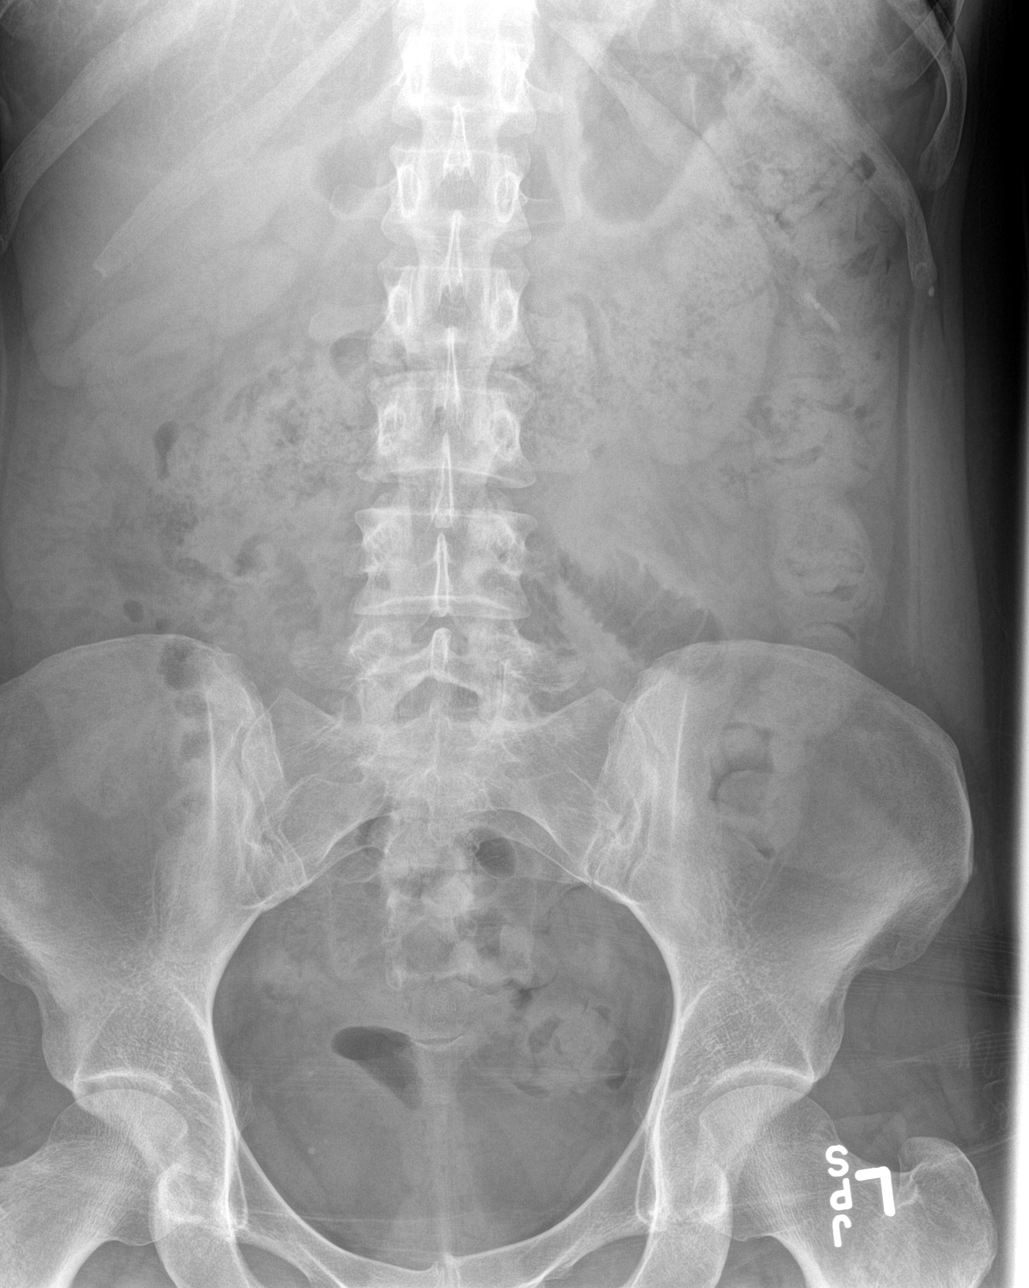

[3 of 3 positions shown; findings below may reference images not displayed]

FINDINGS: Cardiac shadow is within normal limits. The lungs are clear
bilaterally. No acute bony abnormality is noted in the chest.

Scattered large and small bowel gas is noted. A significant amount
of retained fecal material is noted throughout the colon. No
obstructive changes are seen. No acute bony abnormality is noted.
IMPRESSION: Significant retained fecal material without obstructive change. No
other focal abnormality is noted.
